# Patient Record
Sex: Female | Born: 1975 | Race: White | Hispanic: Yes | Smoking: Never smoker
Health system: Southern US, Community
[De-identification: ages and names within clinical notes are randomized; demographics above are authoritative.]

## PROBLEM LIST (undated history)

## (undated) DIAGNOSIS — E7800 Pure hypercholesterolemia, unspecified: Secondary | ICD-10-CM

## (undated) DIAGNOSIS — E559 Vitamin D deficiency, unspecified: Secondary | ICD-10-CM

## (undated) DIAGNOSIS — M25473 Effusion, unspecified ankle: Secondary | ICD-10-CM

## (undated) DIAGNOSIS — R7303 Prediabetes: Secondary | ICD-10-CM

## (undated) DIAGNOSIS — M25579 Pain in unspecified ankle and joints of unspecified foot: Secondary | ICD-10-CM

## (undated) DIAGNOSIS — D649 Anemia, unspecified: Secondary | ICD-10-CM

## (undated) DIAGNOSIS — E538 Deficiency of other specified B group vitamins: Secondary | ICD-10-CM

## (undated) DIAGNOSIS — K5900 Constipation, unspecified: Secondary | ICD-10-CM

## (undated) DIAGNOSIS — M25569 Pain in unspecified knee: Secondary | ICD-10-CM

## (undated) DIAGNOSIS — M25559 Pain in unspecified hip: Secondary | ICD-10-CM

## (undated) DIAGNOSIS — R5383 Other fatigue: Secondary | ICD-10-CM

## (undated) DIAGNOSIS — E039 Hypothyroidism, unspecified: Secondary | ICD-10-CM

## (undated) DIAGNOSIS — F419 Anxiety disorder, unspecified: Secondary | ICD-10-CM

## (undated) HISTORY — DX: Prediabetes: R73.03

## (undated) HISTORY — DX: Hypothyroidism, unspecified: E03.9

## (undated) HISTORY — DX: Pain in unspecified knee: M25.569

## (undated) HISTORY — DX: Anxiety disorder, unspecified: F41.9

## (undated) HISTORY — DX: Pain in unspecified hip: M25.559

## (undated) HISTORY — DX: Vitamin D deficiency, unspecified: E55.9

## (undated) HISTORY — DX: Anemia, unspecified: D64.9

## (undated) HISTORY — DX: Pure hypercholesterolemia, unspecified: E78.00

## (undated) HISTORY — DX: Effusion, unspecified ankle: M25.473

## (undated) HISTORY — DX: Deficiency of other specified B group vitamins: E53.8

## (undated) HISTORY — DX: Other fatigue: R53.83

## (undated) HISTORY — DX: Constipation, unspecified: K59.00

## (undated) HISTORY — DX: Pain in unspecified ankle and joints of unspecified foot: M25.579

---

## 2007-11-12 ENCOUNTER — Emergency Department (HOSPITAL_COMMUNITY): Admission: EM | Admit: 2007-11-12 | Discharge: 2007-11-12 | Payer: Self-pay | Admitting: Emergency Medicine

## 2008-05-16 ENCOUNTER — Emergency Department (HOSPITAL_COMMUNITY): Admission: EM | Admit: 2008-05-16 | Discharge: 2008-05-16 | Payer: Self-pay | Admitting: Emergency Medicine

## 2010-07-07 LAB — DIFFERENTIAL
Basophils Absolute: 0 10*3/uL (ref 0.0–0.1)
Eosinophils Relative: 0 % (ref 0–5)
Lymphocytes Relative: 10 % — ABNORMAL LOW (ref 12–46)
Lymphs Abs: 0.8 10*3/uL (ref 0.7–4.0)
Monocytes Absolute: 0.2 10*3/uL (ref 0.1–1.0)
Neutro Abs: 7.5 10*3/uL (ref 1.7–7.7)

## 2010-07-07 LAB — URINALYSIS, ROUTINE W REFLEX MICROSCOPIC
Bilirubin Urine: NEGATIVE
Glucose, UA: NEGATIVE mg/dL
Ketones, ur: NEGATIVE mg/dL
pH: 6 (ref 5.0–8.0)

## 2010-07-07 LAB — URINE MICROSCOPIC-ADD ON

## 2010-07-07 LAB — BASIC METABOLIC PANEL
Calcium: 9.1 mg/dL (ref 8.4–10.5)
GFR calc Af Amer: >60 mL/min (ref >60)
GFR calc non Af Amer: >60 mL/min (ref >60)
Potassium: 3.7 mEq/L (ref 3.5–5.1)
Sodium: 133 mEq/L — ABNORMAL LOW (ref 135–145)

## 2010-07-07 LAB — CBC
HCT: 40 % (ref 36.0–46.0)
Hemoglobin: 13.6 g/dL (ref 12.0–15.0)
WBC: 8.5 10*3/uL (ref 4.0–10.5)

## 2013-03-19 ENCOUNTER — Ambulatory Visit (INDEPENDENT_AMBULATORY_CARE_PROVIDER_SITE_OTHER): Payer: BC Managed Care – PPO | Admitting: Physician Assistant

## 2013-03-19 VITALS — BP 110/70 | HR 64 | Temp 98.0°F | Resp 16 | Ht 63.0 in | Wt 203.0 lb

## 2013-03-19 DIAGNOSIS — J029 Acute pharyngitis, unspecified: Secondary | ICD-10-CM

## 2013-03-19 DIAGNOSIS — J069 Acute upper respiratory infection, unspecified: Secondary | ICD-10-CM

## 2013-03-19 LAB — POCT RAPID STREP A (OFFICE): Rapid Strep A Screen: NEGATIVE

## 2013-03-19 MED ORDER — GUAIFENESIN ER 1200 MG PO TB12: 1.0000 | ORAL_TABLET | Freq: Two times a day (BID) | ORAL | Status: AC

## 2013-03-19 MED ORDER — IPRATROPIUM BROMIDE 0.06 % NA SOLN: 2.0000 | Freq: Three times a day (TID) | NASAL | Status: DC

## 2013-03-19 MED ORDER — HYDROCOD POLST-CHLORPHEN POLST 10-8 MG/5ML PO LQCR: 5.0000 mL | Freq: Two times a day (BID) | ORAL | Status: AC

## 2013-03-19 NOTE — Progress Notes (Signed)
   Subjective:    Patient ID: Bethany Craig, female    DOB: 1975-08-24, 37 y.o.   MRN: [id number]  HPI Pt presents to clinic with cold symptoms for about 1 week.  She started with nasal congestion with rhinorrhea and sore throat with PND and dry cough.  Now she has started to develop right ear pain without change in hearing.  She has been using OTC cold meds but they do not seem to be working all that well.  She has a sore throat that is definitely worse in the am but it does not get much better as the day goes on.    OTC meds - cold preps - no help Nephew sick with similar symptoms No flu vaccine  Review of Systems  Constitutional: Negative for fever and chills.  HENT: Positive for congestion, ear pain (right ), rhinorrhea (yellow) and sore throat. Negative for postnasal drip (resolved).   Respiratory: Positive for cough (dry).   Psychiatric/Behavioral: Positive for sleep disturbance (2nd to cough).       Objective:   Physical Exam  Vitals reviewed. Constitutional: She is oriented to person, place, and time. She appears well-developed and well-nourished.  HENT:  Head: Normocephalic and atraumatic.  Right Ear: Hearing, tympanic membrane, external ear and ear canal normal.  Left Ear: Hearing, tympanic membrane, external ear and ear canal normal.  Nose: Mucosal edema: red.  Mouth/Throat: Uvula is midline, oropharynx is clear and moist and mucous membranes are normal.  Eyes: Conjunctivae are normal.  Neck: Normal range of motion.  Cardiovascular: Normal rate, regular rhythm and normal heart sounds.   No murmur heard. Pulmonary/Chest: Effort normal and breath sounds normal. She has no wheezes.  Lymphadenopathy:    She has cervical adenopathy (AC enlarged and TTP).  Neurological: She is alert and oriented to person, place, and time.  Skin: Skin is warm and dry.  Psychiatric: She has a normal mood and affect. Her behavior is normal. Judgment and thought content normal.         Assessment & Plan:  Viral URI with cough - Plan: Guaifenesin (MUCINEX MAXIMUM STRENGTH) 1200 MG TB12, ipratropium (ATROVENT) 0.06 % nasal spray, chlorpheniramine-HYDROcodone (TUSSIONEX PENNKINETIC ER) 10-8 MG/5ML LQCR  Sore throat - Plan: POCT rapid strep A  Benny Lennert PA-C 03/19/2013 1:15 PM

## 2013-03-19 NOTE — Patient Instructions (Addendum)
Please push fluids.  Tylenol and Motrin for fever and body aches.   No more over the counter medications.

## 2013-04-01 ENCOUNTER — Emergency Department (HOSPITAL_COMMUNITY)
Admission: EM | Admit: 2013-04-01 | Discharge: 2013-04-01 | Disposition: A | Discharge status: Home / Self Care | Payer: BC Managed Care – PPO | Attending: Emergency Medicine | Admitting: Emergency Medicine

## 2013-04-01 ENCOUNTER — Encounter (HOSPITAL_COMMUNITY): Payer: Self-pay | Admitting: Emergency Medicine

## 2013-04-01 DIAGNOSIS — R52 Pain, unspecified: Secondary | ICD-10-CM | POA: Insufficient documentation

## 2013-04-01 DIAGNOSIS — R197 Diarrhea, unspecified: Secondary | ICD-10-CM | POA: Insufficient documentation

## 2013-04-01 DIAGNOSIS — Z79899 Other long term (current) drug therapy: Secondary | ICD-10-CM | POA: Insufficient documentation

## 2013-04-01 DIAGNOSIS — N898 Other specified noninflammatory disorders of vagina: Secondary | ICD-10-CM | POA: Insufficient documentation

## 2013-04-01 DIAGNOSIS — Z3202 Encounter for pregnancy test, result negative: Secondary | ICD-10-CM | POA: Insufficient documentation

## 2013-04-01 DIAGNOSIS — Z791 Long term (current) use of non-steroidal anti-inflammatories (NSAID): Secondary | ICD-10-CM | POA: Insufficient documentation

## 2013-04-01 DIAGNOSIS — R109 Unspecified abdominal pain: Secondary | ICD-10-CM | POA: Insufficient documentation

## 2013-04-01 DIAGNOSIS — J209 Acute bronchitis, unspecified: Secondary | ICD-10-CM

## 2013-04-01 LAB — URINALYSIS, ROUTINE W REFLEX MICROSCOPIC
Bilirubin Urine: NEGATIVE
GLUCOSE, UA: NEGATIVE mg/dL
KETONES UR: NEGATIVE mg/dL
LEUKOCYTES UA: NEGATIVE
Nitrite: NEGATIVE
PROTEIN: NEGATIVE mg/dL
Specific Gravity, Urine: 1.013 (ref 1.005–1.030)
Urobilinogen, UA: 0.2 mg/dL (ref 0.0–1.0)
pH: 6.5 (ref 5.0–8.0)

## 2013-04-01 LAB — WET PREP, GENITAL
CLUE CELLS WET PREP: NONE SEEN
Trich, Wet Prep: NONE SEEN
Yeast Wet Prep HPF POC: NONE SEEN

## 2013-04-01 LAB — PREGNANCY, URINE: Preg Test, Ur: NEGATIVE

## 2013-04-01 LAB — URINE MICROSCOPIC-ADD ON

## 2013-04-01 MED ORDER — AZITHROMYCIN 250 MG PO TABS: ORAL_TABLET | ORAL | Status: DC

## 2013-04-01 NOTE — ED Provider Notes (Signed)
CSN: [id number]     Arrival date & time 1/[id number]/15  [id number] History   First MD Initiated Contact with Patient 04/01/13 1026     Chief Complaint  Patient presents with  . Generalized Body Aches  . Cough  . Abdominal Pain  . Vaginal Discharge  . Diarrhea   (Consider location/radiation/quality/duration/timing/severity/associated sxs/prior Treatment) HPI Comments: Patient is a 38 year old female presents emergency department with complaints of persistent cough for 2 weeks, and congestion, and generalized malaise. She has tried over-the-counter medications, rest, and time however is not improving.  She also reports a vaginal discharge for the past several days. She denies any pelvic pain. Her last period was last month and was normal. She states she has not been sexually active and months and denies the possibility of pregnancy.  Patient is a 38 y.o. female presenting with cough. The history is provided by the patient.  Cough Cough characteristics:  Non-productive Severity:  Moderate Onset quality:  Gradual Duration:  2 weeks Timing:  Constant Progression:  Worsening Chronicity:  New Smoker: no   Context: sick contacts   Relieved by:  Nothing Worsened by:  Nothing tried Ineffective treatments:  Cough suppressants, decongestant and rest   History reviewed. No pertinent past medical history. Past Surgical History  Procedure Laterality Date  . Cesarean section     Family History  Problem Relation Age of Onset  . Diabetes Mother   . Hyperlipidemia Mother   . Stroke Mother    History  Substance Use Topics  . Smoking status: Never Smoker   . Smokeless tobacco: Not on file  . Alcohol Use: No   OB History   Grav Para Term Preterm Abortions TAB SAB Ect Mult Living                 Review of Systems  Respiratory: Positive for cough.   All other systems reviewed and are negative.    Allergies  Review of patient's allergies indicates no known allergies.  Home Medications    Current Outpatient Rx  Name  Route  Sig  Dispense  Refill  . dextromethorphan (DELSYM) 30 MG/5ML liquid   Oral   Take 30 mg by mouth as needed for cough.         Marland Kitchen. guaiFENesin (MUCINEX) 600 MG 12 hr tablet   Oral   Take 1,200 mg by mouth 2 (two) times daily.         Marland Kitchen. ipratropium (ATROVENT) 0.06 % nasal spray   Nasal   Place 2 sprays into the nose 3 (three) times daily.   15 mL   0   . naproxen sodium (ANAPROX) 220 MG tablet   Oral   Take 440 mg by mouth 2 (two) times daily with a meal.         . Pseudoeph-Doxylamine-DM-APAP (NYQUIL PO)   Oral   Take 30 mLs by mouth at bedtime.          BP 118/71  Pulse 69  Temp(Src) 98.3 F (36.8 C) (Oral)  Resp 14  SpO2 98%  LMP 03/15/2013 Physical Exam  Nursing note and vitals reviewed. Constitutional: She is oriented to person, place, and time. She appears well-developed and well-nourished. No distress.  HENT:  Head: Normocephalic and atraumatic.  Mouth/Throat: Oropharynx is clear and moist.  Bilateral TMs are clear.  Neck: Normal range of motion. Neck supple.  Cardiovascular: Normal rate and regular rhythm.  Exam reveals no gallop and no friction rub.   No murmur heard. Pulmonary/Chest:  Effort normal and breath sounds normal. No respiratory distress. She has no wheezes.  Abdominal: Soft. Bowel sounds are normal. She exhibits no distension. There is no tenderness.  Genitourinary: Uterus normal. Vaginal discharge found.  There is a slight mucousy, yellowish discharge present. There is no adnexal masses and there is no cervical motion tenderness.  Musculoskeletal: Normal range of motion.  Neurological: She is alert and oriented to person, place, and time.  Skin: Skin is warm and dry. She is not diaphoretic.    ED Course  Procedures (including critical care time) Labs Review Labs Reviewed  WET PREP, GENITAL  GC/CHLAMYDIA PROBE AMP  URINALYSIS, ROUTINE W REFLEX MICROSCOPIC  PREGNANCY, URINE   Imaging Review No  results found.    MDM  No diagnosis found. Patient presents here with persistent URI symptoms for 2 weeks. She's had no relief with over-the-counter medications. This appears to be an acute bronchitis which will be treated with Zithromax. She is also complaining of vaginal discharge. Pelvic examination is essentially unremarkable and wet prep revealed only a few white cells. She denies new sexual partners states she has not been sexually active for several months. I will wait for cultures of GC and chlamydia prior to initiating any treatment. She will be discharged with when necessary followup.    Geoffery Lyons, MD 04/01/13 1249

## 2013-04-01 NOTE — ED Notes (Signed)
Urine to lab

## 2013-04-01 NOTE — ED Notes (Addendum)
Pt c/o cough and body aches x 2 wks.  Also c/o diarrhea, "ovary pain" and vaginal discharge.

## 2013-04-01 NOTE — ED Notes (Signed)
Patient in no acute distress upon discharge.

## 2013-04-01 NOTE — ED Notes (Signed)
Pt. Unable to urinate at this time.  Pt. Aware of giving a urine specimen. Pt. Was given a cup of water per Dr. Judd Lienelo. Nurse was notified.

## 2013-04-01 NOTE — Discharge Instructions (Signed)
Zithromax as prescribed.  Return to the emergency department for difficulty breathing, chest pain, severe abdominal pain, or other new or concerning symptoms.   Acute Bronchitis Bronchitis is inflammation of the airways that extend from the windpipe into the lungs (bronchi). The inflammation often causes mucus to develop. This leads to a cough, which is the most common symptom of bronchitis.  In acute bronchitis, the condition usually develops suddenly and goes away over time, usually in a couple weeks. Smoking, allergies, and asthma can make bronchitis worse. Repeated episodes of bronchitis may cause further lung problems.  CAUSES Acute bronchitis is most often caused by the same virus that causes a cold. The virus can spread from person to person (contagious).  SIGNS AND SYMPTOMS   Cough.   Fever.   Coughing up mucus.   Body aches.   Chest congestion.   Chills.   Shortness of breath.   Sore throat.  DIAGNOSIS  Acute bronchitis is usually diagnosed through a physical exam. Tests, such as chest X-rays, are sometimes done to rule out other conditions.  TREATMENT  Acute bronchitis usually goes away in a couple weeks. Often times, no medical treatment is necessary. Medicines are sometimes given for relief of fever or cough. Antibiotics are usually not needed but may be prescribed in certain situations. In some cases, an inhaler may be recommended to help reduce shortness of breath and control the cough. A cool mist vaporizer may also be used to help thin bronchial secretions and make it easier to clear the chest.  HOME CARE INSTRUCTIONS  Get plenty of rest.   Drink enough fluids to keep your urine clear or pale yellow (unless you have a medical condition that requires fluid restriction). Increasing fluids may help thin your secretions and will prevent dehydration.   Only take over-the-counter or prescription medicines as directed by your health care provider.   Avoid  smoking and secondhand smoke. Exposure to cigarette smoke or irritating chemicals will make bronchitis worse. If you are a smoker, consider using nicotine gum or skin patches to help control withdrawal symptoms. Quitting smoking will help your lungs heal faster.   Reduce the chances of another bout of acute bronchitis by washing your hands frequently, avoiding people with cold symptoms, and trying not to touch your hands to your mouth, nose, or eyes.   Follow up with your health care provider as directed.  SEEK MEDICAL CARE IF: Your symptoms do not improve after 1 week of treatment.  SEEK IMMEDIATE MEDICAL CARE IF:  You develop an increased fever or chills.   You have chest pain.   You have severe shortness of breath.  You have bloody sputum.   You develop dehydration.  You develop fainting.  You develop repeated vomiting.  You develop a severe headache. MAKE SURE YOU:   Understand these instructions.  Will watch your condition.  Will get help right away if you are not doing well or get worse. Document Released: 04/15/2004 Document Revised: 11/08/2012 Document Reviewed: 08/29/2012 [facility] Patient Information 2014 [place] NewsExitCare, MarylandLLC.

## 2013-04-02 LAB — GC/CHLAMYDIA PROBE AMP
CT Probe RNA: NEGATIVE
GC PROBE AMP APTIMA: NEGATIVE

## 2013-07-26 ENCOUNTER — Ambulatory Visit: Payer: BC Managed Care – PPO | Admitting: Obstetrics & Gynecology

## 2013-10-21 ENCOUNTER — Encounter (HOSPITAL_COMMUNITY): Payer: Self-pay | Admitting: Emergency Medicine

## 2013-10-21 ENCOUNTER — Emergency Department (HOSPITAL_COMMUNITY)
Admission: EM | Admit: 2013-10-21 | Discharge: 2013-10-21 | Disposition: A | Discharge status: Home / Self Care | Payer: BC Managed Care – PPO | Attending: Emergency Medicine | Admitting: Emergency Medicine

## 2013-10-21 ENCOUNTER — Emergency Department (HOSPITAL_COMMUNITY): Payer: BC Managed Care – PPO

## 2013-10-21 DIAGNOSIS — M25562 Pain in left knee: Secondary | ICD-10-CM

## 2013-10-21 DIAGNOSIS — Z79899 Other long term (current) drug therapy: Secondary | ICD-10-CM | POA: Insufficient documentation

## 2013-10-21 DIAGNOSIS — M25569 Pain in unspecified knee: Secondary | ICD-10-CM | POA: Insufficient documentation

## 2013-10-21 MED ORDER — IBUPROFEN 800 MG PO TABS: 800.0000 mg | ORAL_TABLET | Freq: Three times a day (TID) | ORAL | Status: DC

## 2013-10-21 MED ORDER — IBUPROFEN 800 MG PO TABS
800.0000 mg | ORAL_TABLET | Freq: Once | ORAL | Status: AC
  Administered 2013-10-21: 800 mg via ORAL
  Filled 2013-10-21: qty 1

## 2013-10-21 NOTE — ED Notes (Signed)
She states she experienced left knee pain ~ 2 months ago as she was performing "squats" during her excersize routine.  Seh c/o persistent and increasing pain and swelling of left knee.  She is in no distress.

## 2013-10-21 NOTE — ED Provider Notes (Signed)
Medical screening examination/treatment/procedure(s) were performed by non-physician practitioner and as supervising physician I was immediately available for consultation/collaboration.   EKG Interpretation None        Candyce ChurnJohn David Elihu Milstein III, MD 10/21/13 2233

## 2013-10-21 NOTE — Discharge Instructions (Signed)
Please follow up with your primary care physician in 1-2 days. If you do not have one please call the [facility] number listed above. Please follow up with Dr. Ophelia Charter to schedule a follow up appointment.  Please follow RICE method below. Please read all discharge instructions and return precautions.   Knee Pain The knee is the complex joint between your thigh and your lower leg. It is made up of bones, tendons, ligaments, and cartilage. The bones that make up the knee are:  The femur in the thigh.  The tibia and fibula in the lower leg.  The patella or kneecap riding in the groove on the lower femur. CAUSES  Knee pain is a common complaint with many causes. A few of these causes are:  Injury, such as:  A ruptured ligament or tendon injury.  Torn cartilage.  Medical conditions, such as:  Gout  Arthritis  Infections  Overuse, over training, or overdoing a physical activity. Knee pain can be minor or severe. Knee pain can accompany debilitating injury. Minor knee problems often respond well to self-care measures or get well on their own. More serious injuries may need medical intervention or even surgery. SYMPTOMS The knee is complex. Symptoms of knee problems can vary widely. Some of the problems are:  Pain with movement and weight bearing.  Swelling and tenderness.  Buckling of the knee.  Inability to straighten or extend your knee.  Your knee locks and you cannot straighten it.  Warmth and redness with pain and fever.  Deformity or dislocation of the kneecap. DIAGNOSIS  Determining what is wrong may be very straight forward such as when there is an injury. It can also be challenging because of the complexity of the knee. Tests to make a diagnosis may include:  Your caregiver taking a history and doing a physical exam.  Routine X-rays can be used to rule out other problems. X-rays will not reveal a cartilage tear. Some injuries of the knee can be  diagnosed by:  Arthroscopy a surgical technique by which a small video camera is inserted through tiny incisions on the sides of the knee. This procedure is used to examine and repair internal knee joint problems. Tiny instruments can be used during arthroscopy to repair the torn knee cartilage (meniscus).  Arthrography is a radiology technique. A contrast liquid is directly injected into the knee joint. Internal structures of the knee joint then become visible on X-ray film.  An MRI scan is a non X-ray radiology procedure in which magnetic fields and a computer produce two- or three-dimensional images of the inside of the knee. Cartilage tears are often visible using an MRI scanner. MRI scans have largely replaced arthrography in diagnosing cartilage tears of the knee.  Blood work.  Examination of the fluid that helps to lubricate the knee joint (synovial fluid). This is done by taking a sample out using a needle and a syringe. TREATMENT The treatment of knee problems depends on the cause. Some of these treatments are:  Depending on the injury, proper casting, splinting, surgery, or physical therapy care will be needed.  Give yourself adequate recovery time. Do not overuse your joints. If you begin to get sore during workout routines, back off. Slow down or do fewer repetitions.  For repetitive activities such as cycling or running, maintain your strength and nutrition.  Alternate muscle groups. For example, if you are a weight lifter, work the upper body on one day and the lower body  the next.  Either tight or weak muscles do not give the proper support for your knee. Tight or weak muscles do not absorb the stress placed on the knee joint. Keep the muscles surrounding the knee strong.  Take care of mechanical problems.  If you have flat feet, orthotics or special shoes may help. See your caregiver if you need help.  Arch supports, sometimes with wedges on the inner or outer aspect of  the heel, can help. These can shift pressure away from the side of the knee most bothered by osteoarthritis.  A brace called an "unloader" brace also may be used to help ease the pressure on the most arthritic side of the knee.  If your caregiver has prescribed crutches, braces, wraps or ice, use as directed. The acronym for this is PRICE. This means protection, rest, ice, compression, and elevation.  Nonsteroidal anti-inflammatory drugs (NSAIDs), can help relieve pain. But if taken immediately after an injury, they may actually increase swelling. Take NSAIDs with food in your stomach. Stop them if you develop stomach problems. Do not take these if you have a history of ulcers, stomach pain, or bleeding from the bowel. Do not take without your caregiver's approval if you have problems with fluid retention, heart failure, or kidney problems.  For ongoing knee problems, physical therapy may be helpful.  Glucosamine and chondroitin are over-the-counter dietary supplements. Both may help relieve the pain of osteoarthritis in the knee. These medicines are different from the usual anti-inflammatory drugs. Glucosamine may decrease the rate of cartilage destruction.  Injections of a corticosteroid drug into your knee joint may help reduce the symptoms of an arthritis flare-up. They may provide pain relief that lasts a few months. You may have to wait a few months between injections. The injections do have a small increased risk of infection, water retention, and elevated blood sugar levels.  Hyaluronic acid injected into damaged joints may ease pain and provide lubrication. These injections may work by reducing inflammation. A series of shots may give relief for as long as 6 months.  Topical painkillers. Applying certain ointments to your skin may help relieve the pain and stiffness of osteoarthritis. Ask your pharmacist for suggestions. Many over the-counter products are approved for temporary relief of  arthritis pain.  In some countries, doctors often prescribe topical NSAIDs for relief of chronic conditions such as arthritis and tendinitis. A review of treatment with NSAID creams found that they worked as well as oral medications but without the serious side effects. PREVENTION  Maintain a healthy weight. Extra pounds put more strain on your joints.  Get strong, stay limber. Weak muscles are a common cause of knee injuries. Stretching is important. Include flexibility exercises in your workouts.  Be smart about exercise. If you have osteoarthritis, chronic knee pain or recurring injuries, you may need to change the way you exercise. This does not mean you have to stop being active. If your knees ache after jogging or playing basketball, consider switching to swimming, water aerobics, or other low-impact activities, at least for a few days a week. Sometimes limiting high-impact activities will provide relief.  Make sure your shoes fit well. Choose footwear that is right for your sport.  Protect your knees. Use the proper gear for knee-sensitive activities. Use kneepads when playing volleyball or laying carpet. Buckle your seat belt every time you drive. Most shattered kneecaps occur in car accidents.  Rest when you are tired. SEEK MEDICAL CARE IF:  You have knee  pain that is continual and does not seem to be getting better.  SEEK IMMEDIATE MEDICAL CARE IF:  Your knee joint feels hot to the touch and you have a high fever. MAKE SURE YOU:   Understand these instructions.  Will watch your condition.  Will get help right away if you are not doing well or get worse. Document Released: 01/03/2007 Document Revised: 05/31/2011 Document Reviewed: 01/03/2007 [facility] SystemExit[facility] Patient Information 2015 [place], [place]LL[place]. This information is not intended to replace advice given to you by your health care provider. Make sure you discuss any questions you have with your health care provider.  RICE: Routine  Care for Injuries The routine care of many injuries includes Rest, Ice, Compression, and Elevation (RICE). HOME CARE INSTRUCTIONS  Rest is needed to allow your body to heal. Routine activities can usually be resumed when comfortable. Injured tendons and bones can take up to 6 weeks to heal. Tendons are the cord-like structures that attach muscle to bone.  Ice following an injury helps keep the swelling down and reduces pain.  Put ice in a plastic bag.  Place a towel between your skin and the bag.  Leave the ice on for 15-20 minutes, 3-4 times a day, or as directed by your health care provider. Do this while awake, for the first 24 to 48 hours. After that, continue as directed by your caregiver.  Compression helps keep swelling down. It also gives support and helps with discomfort. If an elastic bandage has been applied, it should be removed and reapplied every 3 to 4 hours. It should not be applied tightly, but firmly enough to keep swelling down. Watch fingers or toes for swelling, bluish discoloration, coldness, numbness, or excessive pain. If any of these problems occur, remove the bandage and reapply loosely. Contact your caregiver if these problems continue.  Elevation helps reduce swelling and decreases pain. With extremities, such as the arms, hands, legs, and feet, the injured area should be placed near or above the level of the heart, if possible. SEEK IMMEDIATE MEDICAL CARE IF:  You have persistent pain and swelling.  You develop redness, numbness, or unexpected weakness.  Your symptoms are getting worse rather than improving after several days. These symptoms may indicate that further evaluation or further X-rays are needed. Sometimes, X-rays may not show a small broken bone (fracture) until 1 week or 10 days later. Make a follow-up appointment with your caregiver. Ask when your X-ray results will be ready. Make sure you get your X-ray results. Document Released: 06/20/2000  Document Revised: 03/13/2013 Document Reviewed: 08/07/2010 [facility] CenterExitCare Patient Information 2015 [place]ExitCare, [place]LLC. This information is not intended to replace advice given to you by your health care provider. Make sure you discuss any questions you have with your health care provider.

## 2013-10-21 NOTE — ED Provider Notes (Signed)
CSN: [id number]     Arrival date & time 10/21/13  1657 History   First MD Initiated Contact with Patient 10/21/13 1751     Chief Complaint  Patient presents with  . Knee Pain     (Consider location/radiation/quality/duration/timing/severity/associated sxs/prior Treatment) HPI Comments: Patient is a 38 yo F presenting to the ED for left knee pain that has been gradually worsening over the last two months after doing squats at the gym. She has had persistent mild to moderate achy throbbing pain to the knee w/o radiation. Alleviating factors: rest. Aggravating factors: ambulation, palpation. Medications tried prior to arrival: Tylenol. No history of fall or injuries. No previous injury to the knee. Denies any numbness, fevers, chills.     Patient is a 38 y.o. female presenting with knee pain.  Knee Pain Associated symptoms: no fever     History reviewed. No pertinent past medical history. Past Surgical History  Procedure Laterality Date  . Cesarean section     Family History  Problem Relation Age of Onset  . Diabetes Mother   . Hyperlipidemia Mother   . Stroke Mother    History  Substance Use Topics  . Smoking status: Never Smoker   . Smokeless tobacco: Not on file  . Alcohol Use: No   OB History   Grav Para Term Preterm Abortions TAB SAB Ect Mult Living                 Review of Systems  Constitutional: Negative for fever and chills.  Musculoskeletal: Positive for arthralgias and myalgias.  Skin: Negative for color change.  All other systems reviewed and are negative.     Allergies  Codeine  Home Medications   Prior to Admission medications   Medication Sig Start Date End Date Taking? Authorizing Provider  acetaminophen (TYLENOL) 500 MG tablet Take 1,000 mg by mouth every 6 (six) hours as needed for mild pain.   Yes Historical Provider, MD  ibuprofen (ADVIL,MOTRIN) 800 MG tablet Take 1 tablet (800 mg total) by mouth 3 (three) times daily. 10/21/13   Salisha Bardsley L  Shila Kruczek, PA-C   BP 125/71  Pulse 80  Temp(Src) 98.2 F (36.8 C) (Oral)  Resp 14  SpO2 100%  LMP 10/10/2013 Physical Exam  Nursing note and vitals reviewed. Constitutional: She is oriented to person, place, and time. She appears well-developed and well-nourished. No distress.  HENT:  Head: Normocephalic and atraumatic.  Right Ear: External ear normal.  Left Ear: External ear normal.  Nose: Nose normal.  Mouth/Throat: Oropharynx is clear and moist.  Eyes: Conjunctivae are normal.  Neck: Normal range of motion. Neck supple.  Cardiovascular: Normal rate, regular rhythm, normal heart sounds and intact distal pulses.   Pulmonary/Chest: Effort normal and breath sounds normal.  Abdominal: Soft.  Musculoskeletal: Normal range of motion.       Right knee: Normal.       Left knee: She exhibits swelling (mild). She exhibits normal range of motion, no ecchymosis, no deformity, no laceration, no erythema, normal alignment and normal patellar mobility. Tenderness found. Medial joint line and patellar tendon tenderness noted. No lateral joint line, no MCL and no LCL tenderness noted.       Right ankle: Normal.       Left ankle: Normal.       Right lower leg: Normal.       Left lower leg: Normal.       Right foot: Normal.       Left foot: Normal.  Negative Lachman's test.  No laxity noted on valgus and varus stress  Neurological: She is alert and oriented to person, place, and time.  Skin: Skin is warm and dry. She is not diaphoretic. No erythema.  Psychiatric: She has a normal mood and affect.    ED Course  Procedures (including critical care time) Medications  ibuprofen (ADVIL,MOTRIN) tablet 800 mg (800 mg Oral Given 10/21/13 1847)    Labs Review Labs Reviewed - No data to display  Imaging Review Dg Knee Complete 4 Views Left  10/21/2013   CLINICAL DATA:  Left knee pain 2 months  EXAM: LEFT KNEE - COMPLETE 4+ VIEW  COMPARISON:  None.  FINDINGS: Small joint effusion. Negative for  fracture. Joint spaces are maintained and there is no significant degenerative change.  IMPRESSION: Small joint effusion.   Electronically Signed   By: Marlan Palau M.D.   On: 10/21/2013 17:31     EKG Interpretation None      MDM   Final diagnoses:  Left knee pain    Filed Vitals:   10/21/13 1710  BP: 125/71  Pulse: 80  Temp: 98.2 F (36.8 C)  Resp: 14   Afebrile, NAD, non-toxic appearing, AAOx4.  Neurovascularly intact. Normal sensation. Patient X-Ray negative for obvious fracture or dislocation.Small effusion noted on X-ray. Pain managed in ED. Pt advised to follow up with orthopedics if symptoms persist for possible ligament or meniscal injury. Patient given brace while in ED, conservative therapy recommended and discussed. Patient will be dc home & is agreeable with above plan. Patient is stable at time of discharge.      Jeannetta Ellis, PA-C 10/21/13 1900

## 2013-12-07 ENCOUNTER — Encounter (HOSPITAL_COMMUNITY): Payer: Self-pay | Admitting: Emergency Medicine

## 2013-12-07 ENCOUNTER — Emergency Department (HOSPITAL_COMMUNITY)
Admission: EM | Admit: 2013-12-07 | Discharge: 2013-12-07 | Disposition: A | Discharge status: Home / Self Care | Payer: BC Managed Care – PPO | Attending: Emergency Medicine | Admitting: Emergency Medicine

## 2013-12-07 ENCOUNTER — Emergency Department (HOSPITAL_COMMUNITY): Payer: BC Managed Care – PPO

## 2013-12-07 DIAGNOSIS — R079 Chest pain, unspecified: Secondary | ICD-10-CM | POA: Insufficient documentation

## 2013-12-07 DIAGNOSIS — R071 Chest pain on breathing: Secondary | ICD-10-CM | POA: Insufficient documentation

## 2013-12-07 DIAGNOSIS — R0789 Other chest pain: Secondary | ICD-10-CM

## 2013-12-07 DIAGNOSIS — Z791 Long term (current) use of non-steroidal anti-inflammatories (NSAID): Secondary | ICD-10-CM | POA: Insufficient documentation

## 2013-12-07 LAB — BASIC METABOLIC PANEL
ANION GAP: 11 (ref 5–15)
BUN: 14 mg/dL (ref 6–23)
CALCIUM: 9.4 mg/dL (ref 8.4–10.5)
CO2: 25 mEq/L (ref 19–32)
CREATININE: 0.63 mg/dL (ref 0.50–1.10)
Chloride: 103 mEq/L (ref 96–112)
Glucose, Bld: 93 mg/dL (ref 70–99)
Potassium: 3.8 mEq/L (ref 3.7–5.3)
Sodium: 139 mEq/L (ref 137–147)

## 2013-12-07 LAB — I-STAT TROPONIN, ED: Troponin i, poc: 0 ng/mL (ref 0.00–0.08)

## 2013-12-07 LAB — CBC
HCT: 36.9 % (ref 36.0–46.0)
Hemoglobin: 12.8 g/dL (ref 12.0–15.0)
MCH: 30.7 pg (ref 26.0–34.0)
MCHC: 34.7 g/dL (ref 30.0–36.0)
MCV: 88.5 fL (ref 78.0–100.0)
PLATELETS: 357 10*3/uL (ref 150–400)
RBC: 4.17 MIL/uL (ref 3.87–5.11)
RDW: 12.4 % (ref 11.5–15.5)
WBC: 6.3 10*3/uL (ref 4.0–10.5)

## 2013-12-07 MED ORDER — NAPROXEN 500 MG PO TABS: 500.0000 mg | ORAL_TABLET | Freq: Two times a day (BID) | ORAL | Status: DC

## 2013-12-07 MED ORDER — KETOROLAC TROMETHAMINE 30 MG/ML IJ SOLN
60.0000 mg | Freq: Once | INTRAMUSCULAR | Status: AC
  Administered 2013-12-07: 60 mg via INTRAMUSCULAR
  Filled 2013-12-07: qty 2

## 2013-12-07 NOTE — ED Notes (Signed)
Brought pt back to room; pt now getting undressed and into a gown at this time; Chastity, NT aware of pt

## 2013-12-07 NOTE — Discharge Instructions (Signed)

## 2013-12-07 NOTE — ED Notes (Signed)
Pt having chest pain, left arm and shoulder pain. sts some pain with breathing in and SOB. sts for 2 days.

## 2013-12-07 NOTE — ED Provider Notes (Signed)
CSN: [id number]     Arrival date & time 12/07/13  1046 History   First MD Initiated Contact with Patient 12/07/13 1536     Chief Complaint  Patient presents with  . Chest Pain     (Consider location/radiation/quality/duration/timing/severity/associated sxs/prior Treatment) Patient is a 38 y.o. female presenting with chest pain. The history is provided by the patient. No language interpreter was used.  Chest Pain Pain location:  Substernal area and L chest Pain quality: tightness   Pain radiates to:  Does not radiate Pain radiates to the back: no   Pain severity:  Moderate Onset quality:  Sudden Duration:  9 hours Timing:  Constant Progression:  Unchanged Chronicity:  New Context: at rest   Relieved by:  Nothing Worsened by:  Deep breathing (stretching of her shoulders) Associated symptoms: fatigue (3 days), shortness of breath and weakness (generalixed)   Associated symptoms: no abdominal pain, no anorexia, no anxiety, no back pain, no cough, no diaphoresis, no fever, no headache, no lower extremity edema, no nausea, no numbness, no palpitations and not vomiting   Shortness of breath:    Severity:  Mild   Duration:  9 hours   Timing:  Constant   Progression:  Unchanged Risk factors: no aortic disease, no birth control, no coronary artery disease, no diabetes mellitus, no Ehlers-Danlos syndrome, no high cholesterol, no hypertension, no immobilization, not female, no Marfan's syndrome, not obese, not pregnant, no prior DVT/PE, no smoking and no surgery     History reviewed. No pertinent past medical history. Past Surgical History  Procedure Laterality Date  . Cesarean section     Family History  Problem Relation Age of Onset  . Diabetes Mother   . Hyperlipidemia Mother   . Stroke Mother    History  Substance Use Topics  . Smoking status: Never Smoker   . Smokeless tobacco: Not on file  . Alcohol Use: No   OB History   Grav Para Term Preterm Abortions TAB SAB Ect Mult  Living                 Review of Systems  Constitutional: Positive for fatigue (3 days). Negative for fever, chills, diaphoresis, activity change and appetite change.  HENT: Negative for congestion, facial swelling, rhinorrhea and sore throat.   Eyes: Negative for photophobia and discharge.  Respiratory: Positive for shortness of breath. Negative for cough and chest tightness.   Cardiovascular: Positive for chest pain. Negative for palpitations and leg swelling.  Gastrointestinal: Negative for nausea, vomiting, abdominal pain, diarrhea and anorexia.  Endocrine: Negative for polydipsia and polyuria.  Genitourinary: Negative for dysuria, frequency, difficulty urinating and pelvic pain.  Musculoskeletal: Negative for arthralgias, back pain, neck pain and neck stiffness.  Skin: Negative for color change and wound.  Allergic/Immunologic: Negative for immunocompromised state.  Neurological: Positive for weakness (generalixed). Negative for facial asymmetry, numbness and headaches.  Hematological: Does not bruise/bleed easily.  Psychiatric/Behavioral: Negative for confusion and agitation.      Allergies  Codeine  Home Medications   Prior to Admission medications   Medication Sig Start Date End Date Taking? Authorizing Provider  ibuprofen (ADVIL,MOTRIN) 800 MG tablet Take 1 tablet (800 mg total) by mouth 3 (three) times daily. 10/21/13  Yes Jennifer L Piepenbrink, PA-C  naproxen (NAPROSYN) 500 MG tablet Take 1 tablet (500 mg total) by mouth 2 (two) times daily. 12/07/13   Toy Cookey, MD   BP 106/57  Pulse 71  Temp(Src) 98.3 F (36.8 C)  Resp 14  SpO2 100%  LMP 11/23/2013 Physical Exam  Constitutional: She is oriented to person, place, and time. She appears well-developed and well-nourished. No distress.  HENT:  Head: Normocephalic and atraumatic.  Mouth/Throat: No oropharyngeal exudate.  Eyes: Pupils are equal, round, and reactive to light.  Neck: Normal range of motion. Neck  supple.  Cardiovascular: Normal rate, regular rhythm and normal heart sounds.  Exam reveals no gallop and no friction rub.   No murmur heard. Pulmonary/Chest: Effort normal and breath sounds normal. No respiratory distress. She has no wheezes. She has no rales. She exhibits tenderness.    Abdominal: Soft. Bowel sounds are normal. She exhibits no distension and no mass. There is no tenderness. There is no rebound and no guarding.  Musculoskeletal: Normal range of motion. She exhibits no edema and no tenderness.  Neurological: She is alert and oriented to person, place, and time.  Skin: Skin is warm and dry.  Psychiatric: She has a normal mood and affect.    ED Course  Procedures (including critical care time) Labs Review Labs Reviewed  CBC  BASIC METABOLIC PANEL  Rosezena Sensor, ED    Imaging Review Dg Chest 2 View  12/07/2013   CLINICAL DATA:  Chest pain and difficulty breathing  EXAM: CHEST  2 VIEW  COMPARISON:  May 16, 2008  FINDINGS: Lungs are clear. Heart size and pulmonary vascularity are normal. No adenopathy. No pneumothorax. No bone lesions. Next no abnormality noted.  IMPRESSION: No active cardiopulmonary disease.   Electronically Signed   By: Bretta Bang M.D.   On: 12/07/2013 11:57     EKG Interpretation   Date/Time:  Friday December 07 2013 10:57:45 EDT Ventricular Rate:  77 PR Interval:  154 QRS Duration: 86 QT Interval:  380 QTC Calculation: 430 R Axis:   87 Text Interpretation:  Normal sinus rhythm Low voltage QRS Borderline ECG  No prior for comparison Confirmed by Paz Winsett  MD, Eliu Batch ([id number]) on  12/07/2013 3:37:38 PM      MDM   Final diagnoses:  Chest wall pain    Pt is a 38 y.o. female with Pmhx as above who presents with a central and left-sided chest tightness worse with inspiration and worse with stretching of her shoulders for 9 hours. She has mild shortness of breath but denies associated nausea, vomiting, diaphoresis, cough, lower  [place] pain or swelling. She is a nonsmoker. No estrogen use. She reports a brother with history of MI in his 46s, but states that he did not have stents placed, open-heart surgery, and does not see a cardiologist. On physical exam vital signs are stable and she is in no acute distress. EKG has no acute ischemic changes. Chest x-ray is normal. I-STAT troponin is negative which is reassuring given her constant nature for pain for 9 hours. She is reproducible chest wall pain on physical exam. Physical exam is otherwise unremarkable. She is PERC negative.  I doubt ACS and also doubt PE, pneumonia, pneumothorax. At this pain is much likely to be musculoskeletal in nature. 60 mg IM Toradol given with some improvement. Will d/c with scheduled naproxen. Return precautions given for new or worsening symptoms including worsening pain, assoc symptoms such as worsening SOB, n/v, diaphoresis, leg pain/swelling.          Toy Cookey, MD 12/07/13 [phone]

## 2014-03-18 ENCOUNTER — Encounter: Payer: Self-pay | Admitting: *Deleted

## 2014-03-19 ENCOUNTER — Encounter: Payer: Self-pay | Admitting: Obstetrics & Gynecology

## 2016-01-14 IMAGING — CR DG CHEST 2V
2 series · 2 of 2 positions shown · non-contrast
Comparison: May 16, 2008

CLINICAL DATA: Chest pain and difficulty breathing

EXAM:
CHEST  2 VIEW

[w chest pa]
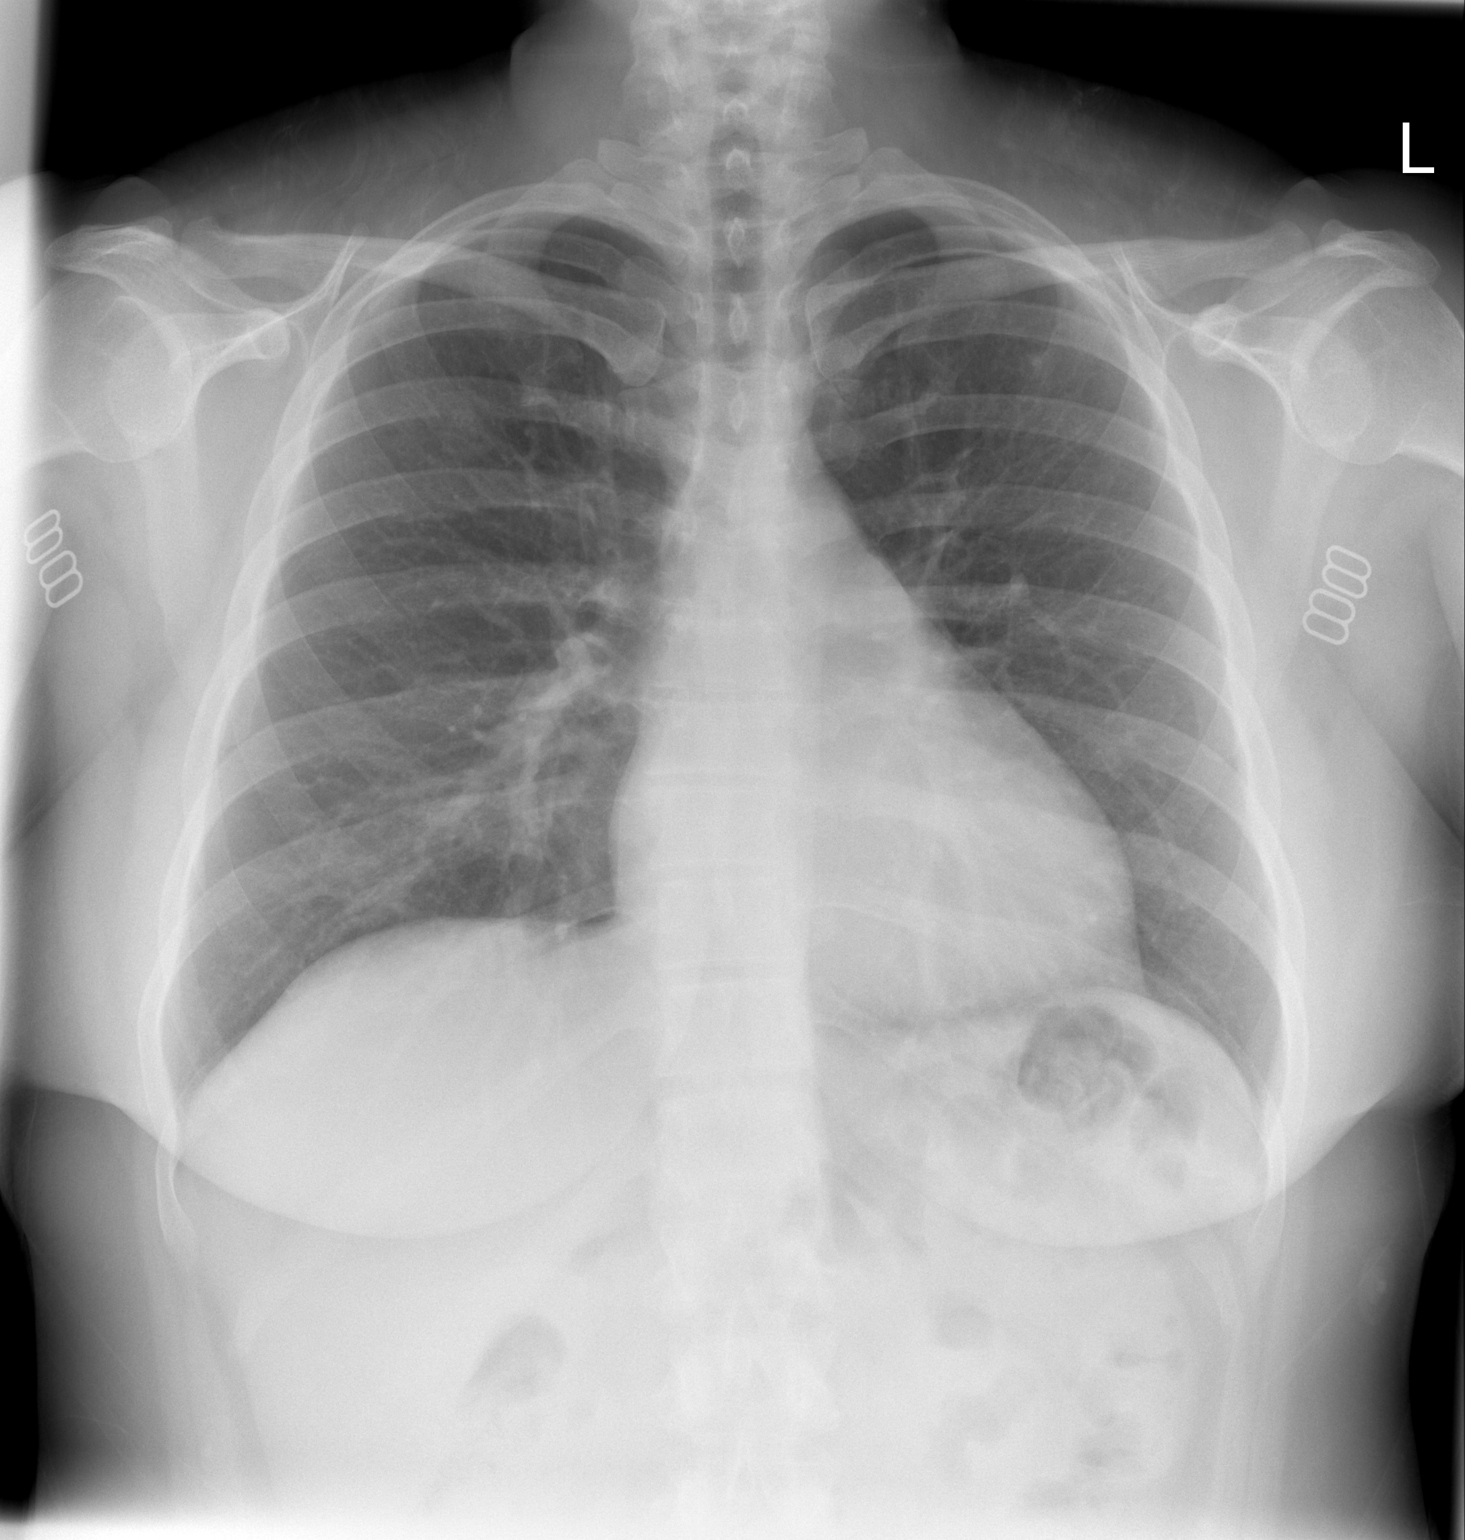

[w chest lat]
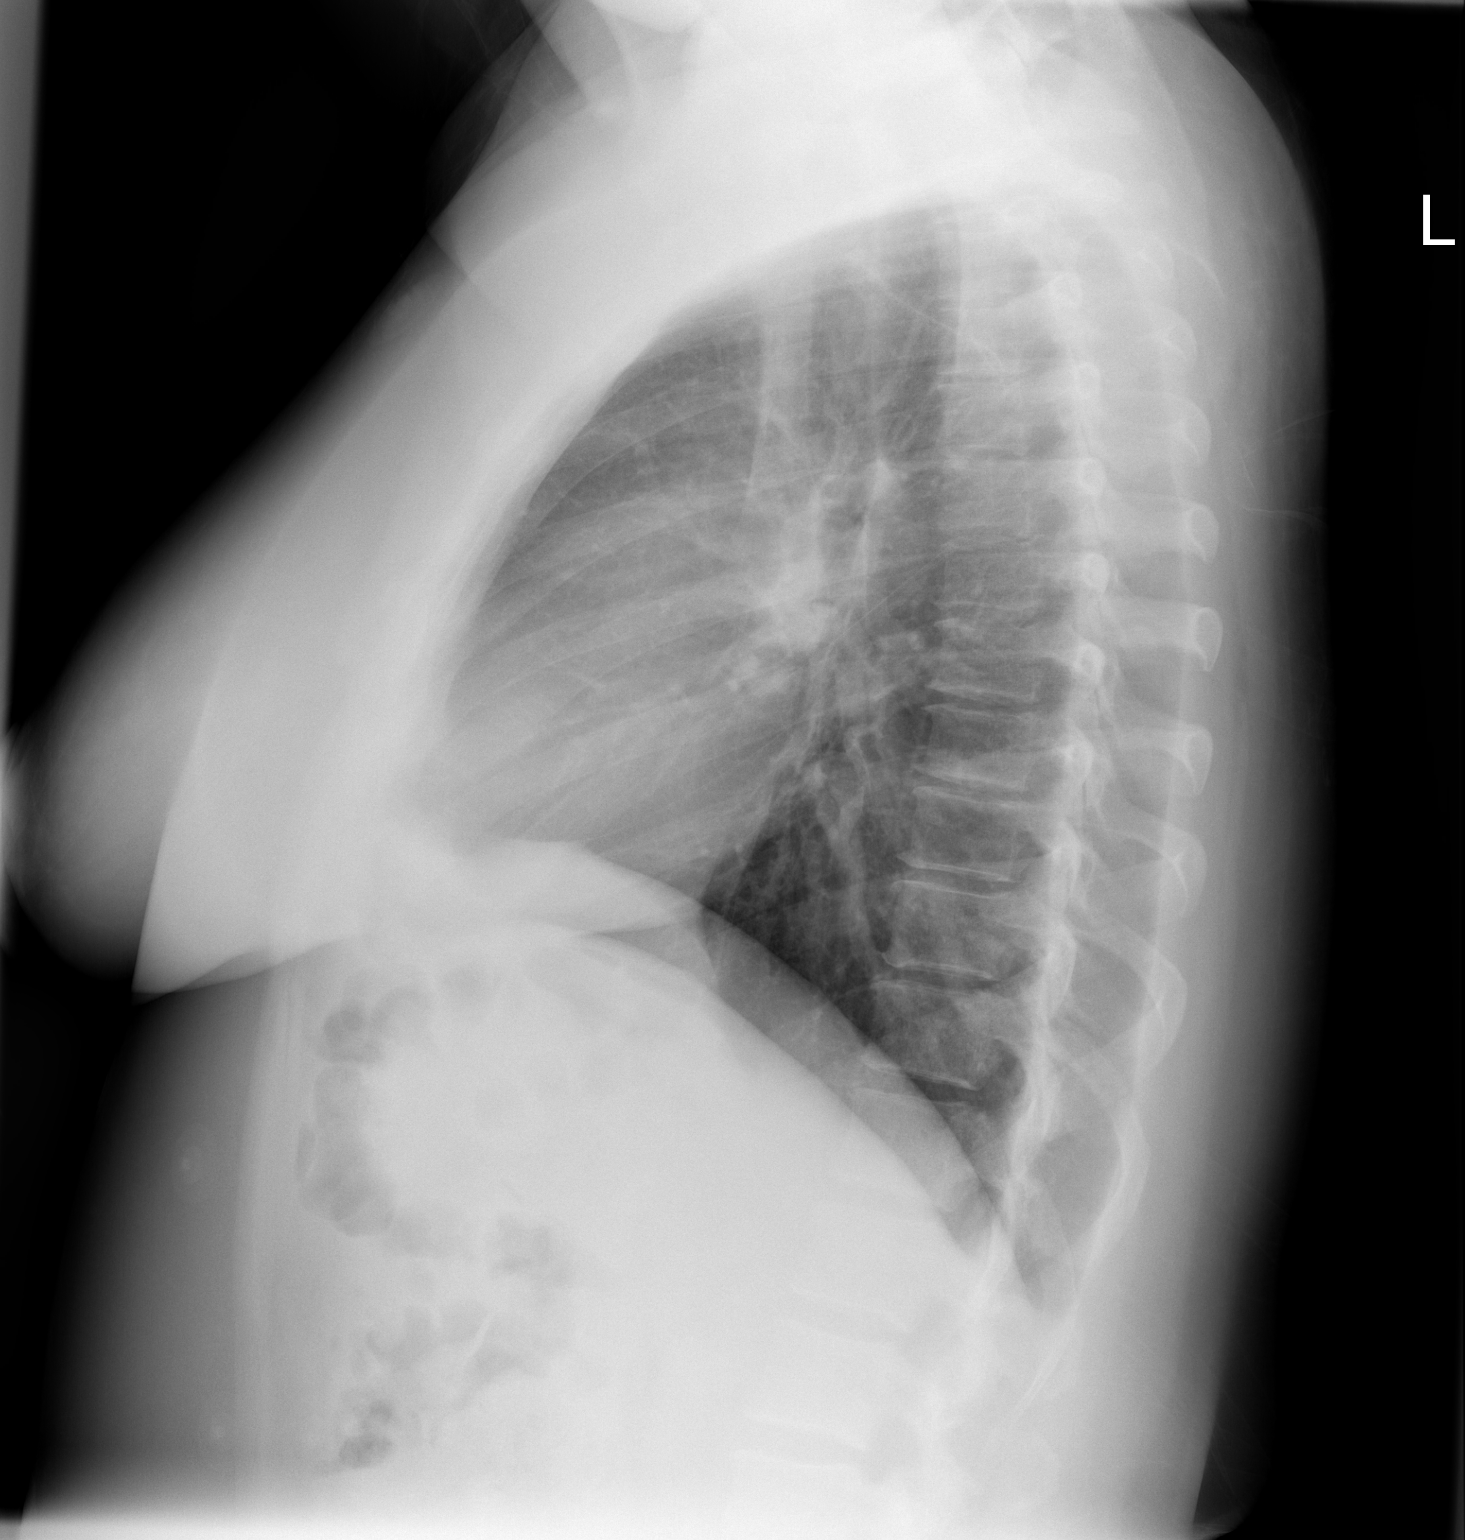

[2 of 2 positions shown; findings below may reference images not displayed]

FINDINGS: Lungs are clear. Heart size and pulmonary vascularity are normal. No
adenopathy. No pneumothorax. No bone lesions. Next no abnormality
noted.
IMPRESSION: No active cardiopulmonary disease.

## 2018-10-09 ENCOUNTER — Other Ambulatory Visit: Payer: Self-pay

## 2018-10-09 ENCOUNTER — Emergency Department (HOSPITAL_COMMUNITY)
Admission: EM | Admit: 2018-10-09 | Discharge: 2018-10-09 | Disposition: A | Discharge status: Home / Self Care | Payer: 59 | Attending: Emergency Medicine | Admitting: Emergency Medicine

## 2018-10-09 ENCOUNTER — Encounter (HOSPITAL_COMMUNITY): Payer: Self-pay

## 2018-10-09 DIAGNOSIS — Z20828 Contact with and (suspected) exposure to other viral communicable diseases: Secondary | ICD-10-CM | POA: Insufficient documentation

## 2018-10-09 DIAGNOSIS — J029 Acute pharyngitis, unspecified: Secondary | ICD-10-CM | POA: Diagnosis present

## 2018-10-09 DIAGNOSIS — B349 Viral infection, unspecified: Secondary | ICD-10-CM | POA: Diagnosis not present

## 2018-10-09 DIAGNOSIS — Z79899 Other long term (current) drug therapy: Secondary | ICD-10-CM | POA: Diagnosis not present

## 2018-10-09 LAB — GROUP A STREP BY PCR: Group A Strep by PCR: NOT DETECTED

## 2018-10-09 NOTE — ED Notes (Signed)
Patient verbalizes understanding of discharge instructions. Opportunity for questioning and answers were provided. Armband removed by staff, pt discharged from ED.  

## 2018-10-09 NOTE — ED Provider Notes (Signed)
[facility]   CSN: [id number] Arrival date & time: 10/09/18  1145     History   Chief Complaint Chief Complaint  Patient presents with  . covid symptoms    HPI Bethany Craig is a 43 y.o. female.     HPI   Patient is a 43 year old female with no significant past medical history presenting for sore throat, congestion, rhinorrhea and headache.  Patient reports that symptoms began 2 days ago with a sore throat.  Reports that the posterior throat feels "scratchy".  Denies difficulty breathing or swallowing.  She reports that she subsequently developed nasal congestion and rhinorrhea.  She has a frontal headache.  She has a history of getting headaches, but usually does not when she is sick.  Denies fevers, chills, cough, shortness of breath, nausea, vomiting, abdominal pain, dysuria, urgency, frequency or flank pain.  Denies taking remedies at home.  No known COVID-19 exposures however she has been an outdoor large gatherings for her daughter softball team.  History reviewed. No pertinent past medical history.  There are no active problems to display for this patient.   Past Surgical History:  Procedure Laterality Date  . CESAREAN SECTION       OB History   No obstetric history on file.      Home Medications    Prior to Admission medications   Medication Sig Start Date End Date Taking? Authorizing Provider  ibuprofen (ADVIL,MOTRIN) 800 MG tablet Take 1 tablet (800 mg total) by mouth 3 (three) times daily. 10/21/13   Piepenbrink, Victorino DikeJennifer, PA-C  naproxen (NAPROSYN) 500 MG tablet Take 1 tablet (500 mg total) by mouth 2 (two) times daily. 12/07/13   Toy Cookeyocherty, Megan, MD    Family History Family History  Problem Relation Age of Onset  . Diabetes Mother   . Hyperlipidemia Mother   . Stroke Mother     Social History Social History   Tobacco Use  . Smoking status: Never Smoker  . Smokeless tobacco: Never Used   Substance Use Topics  . Alcohol use: No  . Drug use: No     Allergies   Codeine   Review of Systems Review of Systems  Constitutional: Negative for chills and fever.  HENT: Positive for congestion, rhinorrhea and sore throat. Negative for ear pain.   Respiratory: Negative for cough, wheezing and stridor.   Gastrointestinal: Negative for nausea and vomiting.     Physical Exam Updated Vital Signs BP 122/79   Pulse 73   Temp 98.3 F (36.8 C) (Oral)   Resp 16   Ht 5\' 3"  (1.6 m)   Wt 104.3 kg   LMP 10/03/2018   SpO2 100%   BMI 40.74 kg/m   Physical Exam Vitals signs and nursing note reviewed.  Constitutional:      General: She is not in acute distress.    Appearance: She is well-developed.  HENT:     Head: Normocephalic and atraumatic.     Right Ear: Tympanic membrane normal.     Left Ear: Tympanic membrane normal.     Mouth/Throat:     Comments: Normal phonation. No muffled voice sounds. Patient swallows secretions without difficulty. Dentition normal. No lesions of tongue or buccal mucosa. Uvula midline. No asymmetric swelling of the posterior pharynx. Minimal erythema of posterior pharynx. No tonsillar exuduate. No lingual swelling. No induration inferior to tongue. No submandibular tenderness, swelling, or induration.  Tissues of the neck supple. No cervical lymphadenopathy. Right TM  without erythema or effusion; left TM without erythema or effusion.  Eyes:     Conjunctiva/sclera: Conjunctivae normal.     Pupils: Pupils are equal, round, and reactive to light.  Neck:     Musculoskeletal: Normal range of motion and neck supple.  Cardiovascular:     Rate and Rhythm: Normal rate and regular rhythm.     Heart sounds: S1 normal and S2 normal. No murmur.  Pulmonary:     Effort: Pulmonary effort is normal.     Breath sounds: Normal breath sounds. No wheezing or rales.  Abdominal:     General: There is no distension.     Palpations: Abdomen is soft.      Tenderness: There is no abdominal tenderness. There is no guarding.  Musculoskeletal: Normal range of motion.        General: No deformity.  Lymphadenopathy:     Cervical: No cervical adenopathy.  Skin:    General: Skin is warm and dry.     Findings: No erythema or rash.  Neurological:     Mental Status: She is alert.     Comments: Cranial nerves grossly intact. Patient moves extremities symmetrically and with good coordination.  Psychiatric:        Behavior: Behavior normal.        Thought Content: Thought content normal.        Judgment: Judgment normal.      ED Treatments / Results  Labs (all labs ordered are listed, but only abnormal results are displayed) Labs Reviewed  GROUP A STREP BY PCR  NOVEL CORONAVIRUS, NAA (HOSPITAL ORDER, SEND-OUT TO REF LAB)    EKG None  Radiology No results found.  Procedures Procedures (including critical care time)  Medications Ordered in ED Medications - No data to display   Initial Impression / Assessment and Plan / ED Course  I have reviewed the triage vital signs and the nursing notes.  Pertinent labs & imaging results that were available during my care of the patient were reviewed by me and considered in my medical decision making (see chart for details).  Clinical Course as of Oct 09 1542  Mon Oct 09, 2018  1452 Group A Strep by PCR: NOT DETECTED [AM]    Clinical Course User Index [AM] Elisha PonderMurray, Daphna Lafuente B, PA-C      This is a well-appearing 43 year old female with no significant past medical history presenting for sore throat, congestion, and headache.  She is well-appearing with normal vital signs.  Neurologically intact.  No signs of RPA or PTA or deep space infection of the head and neck.  She is tolerating secretions here in the emergency department.  Send out test for COVID-19 performed.  Rapid strep is negative.  Patient instructed to take over-the-counter analgesics and Mucinex DM for congestion.  Return precautions  given for any worsening symptoms, shortness of breath, intractable nausea or vomiting.  She was instructed on the natural course of COVID-19 and reasons to return should her test return positive.  Patient is in understanding and agrees with the plan of care.  Bethany Craig was evaluated in Emergency Department on 10/09/2018 for the symptoms described in the history of present illness. She was evaluated in the context of the global COVID-19 pandemic, which necessitated consideration that the patient might be at risk for infection with the SARS-CoV-2 virus that causes COVID-19. Institutional protocols and algorithms that pertain to the evaluation of patients at risk for COVID-19 are in a state of rapid change  based on information released by regulatory bodies including the CDC and federal and state organizations. These policies and algorithms were followed during the patient's care in the ED.  Final Clinical Impressions(s) / ED Diagnoses   Final diagnoses:  Sore throat  Viral illness    ED Discharge Orders    None       Tamala Julian 10/09/18 [place], [place], DO 10/09/18 1655

## 2018-10-09 NOTE — Discharge Instructions (Addendum)
Please see the information and instructions below regarding your visit.  Your diagnoses today include:  1. Sore throat   2. Viral illness    You have been tested for COVID-19.  This test will result in approximately 24 to 96 hours.  You will get a call for positive results.  To see a result as soon as possible you can follow it on my chart.  You are diagnosed today with a viral respiratory illness. One of the major respiratory illnesses of concern circulating in our community is COVID-19. The Novel Coronavirus 2019, was first reported on in [place] in late December 2019.  The outbreak was declared a public health emergency of international concern in January 2020 and on March 11th, 2020, the outbreak was declared a global pandemic.  The spread of this virus is now global with lots of media attention.  The virus has been named SARS-CoV-2 and the disease it causes has become known as coronavirus disease 2019 (COVID-19). Although symptoms can be variable, the predominant symptoms are fever, cough, and shortness of breath.   The incubation period (period during which the patient has the disease and can spread it to others but does not have symptoms of disease) is from 5-14 days.   [place] is considered to have "community spread" of COVID-19. This means that we are seeing cases of COVID-19 that we cannot connect to other known positive cases. Please refer to the information below on Home Care and from the [place] Department of Health and Human Services to guide you on keeping your family safe and returning to normal activity.  Tests performed today include: See side panel of your discharge paperwork for testing performed today. Vital signs are listed at the bottom of these instructions.   Medications prescribed:    Take any prescribed medications only as prescribed, and any over the counter medications only as directed on the packaging.  There is ongoing research on the best medication to take  for fever with COVID-19. Some preliminary information suggested a hypothetical adverse response to non-steroidal anti-inflammatory medications (ibuprofen/Motrin/Advil, or naproxen/Aleve). This is not proven in literature just yet. It is reasonable to initially try to control fever with Acetaminophen/Tylenol, 650 mg every 4 hours or 1000 mg every 6 hours. DO NOT EXCEED 4000 MG OF ACETAMINOPHEN/TYLENOL IN ONE DAY AS IT CAN HARM THE LIVER.  If fever and body aches are not not controlled on Acetaminophen/Tylenol alone and you can safely take non-steroidal anti-inflammatories, I recommend ibuprofen 600 mg every 6 hours as needed. This is not a long-term medication unless under the care and direction of your primary provider. Taking this medication long-term and not under the supervision of a healthcare provider could increase the risk of stomach ulcers, kidney problems, and cardiovascular problems such as high blood pressure.   Please take Mucinex DM twice a day for 5 to 7 days.  Home care instructions:  Please follow any educational materials contained in this packet.   Since we do not have the ability to widely test patients for COVID-19, the Centers for Disease Control and Prevention (CDC) recommend the following guidelines currently for self-quarantine during a respiratory illness:   -At least 3 days (72 hours) have passed since recovery defined as resolution of fever without the use of fever-reducing medications and improvement in respiratory symptoms (e.g., cough, shortness of breath); and, -At least 10 days have passed since symptoms first appeared.  Your illness is contagious and can be spread to others, especially during  the phase of illness during which you have a fever. It cannot be cured by antibiotics or other medicines. Take basic precautions such as washing your hands often, covering your mouth when you cough or sneeze, and avoiding public places where you could spread your illness to others.    Please continue drinking plenty of fluids.  Use over-the-counter medicines as needed as directed on packaging for symptom relief.  You may also use ibuprofen or tylenol as directed on packaging for pain or fever.  Do not take multiple medicines containing Tylenol or acetaminophen to avoid taking too much of this medication.  Follow-up instructions:   Return instructions:  Please return to the Emergency Department if you experience worsening symptoms.  RETURN IMMEDIATELY IF you develop shortness of breath, chest pain, confusion or altered mental status, a new rash, become dizzy, faint, or poorly responsive, or are unable to be cared for at home. Some patients develop bacterial pneumonia as a result of viral respiratory infections. Signs and symptoms include return of fever, increased production of green or yellow phlegm, chest pain, shortness of breath. You need to be reevaluated if you experience these symptoms. Please return if you have persistent vomiting and cannot keep down fluids or develop a fever that is not controlled by Acetaminophen (Tylenol) or Ibuprofen (Motrin). Please return if you have any other emergent concerns.   Additional Information:   Your vital signs today were: BP 109/69    Pulse 79    Temp 98.3 F (36.8 C) (Oral)    Resp 16    Ht 5\' 3"  (1.6 m)    Wt 104.3 kg    LMP 10/03/2018    SpO2 99%    BMI 40.74 kg/m  If your blood pressure (BP) was elevated on multiple readings during this visit above 130 for the top number or above 80 for the bottom number, please have this repeated by your primary care provider within one month. --------------  Thank you for allowing us to participate in your care today.        Individuals who are confirmed to have, or are being evaluated for, COVID-19 should follow the prevention steps below until a healthcare provider or local or state health department says they can return to normal activities.  Stay home except to get medical  care You should restrict activities outside your home, except for getting medical care. Do not go to work, school, or public areas, and do not use public transportation or taxis.  Call ahead before visiting your doctor Before your medical appointment, call the healthcare provider and tell them that you have, or are being evaluated for, COVID-19 infection. This will help the healthcare providers office take steps to keep other people from getting infected. Ask your healthcare provider to call the local or state health department.  Monitor your symptoms Seek prompt medical attention if your illness is worsening (e.g., difficulty breathing). Before going to your medical appointment, call the healthcare provider and tell them that you have, or are being evaluated for, COVID-19 infection. Ask your healthcare provider to call the local or state health department.  Wear a facemask You should wear a facemask that covers your nose and mouth when you are in the same room with other people and when you visit a healthcare provider. People who live with or visit you should also wear a facemask while they are in the same room with you.  Separate yourself from other people in your home As much as possible,  you should stay in a different room from other people in your home. Also, you should use a separate bathroom, if available.  Avoid sharing household items You should not share dishes, drinking glasses, cups, eating utensils, towels, bedding, or other items with other people in your home. After using these items, you should wash them thoroughly with soap and water.  Cover your coughs and sneezes Cover your mouth and nose with a tissue when you cough or sneeze, or you can cough or sneeze into your sleeve. Throw used tissues in a lined trash can, and immediately wash your hands with soap and water for at least 20 seconds or use an alcohol-based hand rub.  Wash your [employer] Wash your hands often and  thoroughly with soap and water for at least 20 seconds. You can use an alcohol-based hand sanitizer if soap and water are not available and if your hands are not visibly dirty. Avoid touching your eyes, nose, and mouth with unwashed hands.   Prevention Steps for Caregivers and Household Members of Individuals Confirmed to have, or Being Evaluated for, COVID-19 Infection Being Cared for in the Home  If you live with, or provide care at home for, a person confirmed to have, or being evaluated for, COVID-19 infection please follow these guidelines to prevent infection:  Follow healthcare providers instructions Make sure that you understand and can help the patient follow any healthcare provider instructions for all care.  Provide for the patients basic needs You should help the patient with basic needs in the home and provide support for getting groceries, prescriptions, and other personal needs.  Monitor the patients symptoms If they are getting sicker, call his or her medical provider and tell them that the patient has, or is being evaluated for, COVID-19 infection. This will help the healthcare providers office take steps to keep other people from getting infected. Ask the healthcare provider to call the local or state health department.  Limit the number of people who have contact with the patient If possible, have only one caregiver for the patient. Other household members should stay in another home or place of residence. If this is not possible, they should stay in another room, or be separated from the patient as much as possible. Use a separate bathroom, if available. Restrict visitors who do not have an essential need to be in the home.  Keep older adults, very young children, and other sick people away from the patient Keep older adults, very young children, and those who have compromised immune systems or chronic health conditions away from the patient. This includes people  with chronic heart, lung, or kidney conditions, diabetes, and cancer.  Ensure good ventilation Make sure that shared spaces in the home have good air flow, such as from an air conditioner or an opened window, weather permitting.  Wash your hands often Wash your hands often and thoroughly with soap and water for at least 20 seconds. You can use an alcohol based hand sanitizer if soap and water are not available and if your hands are not visibly dirty. Avoid touching your eyes, nose, and mouth with unwashed hands. Use disposable paper towels to dry your hands. If not available, use dedicated cloth towels and replace them when they become wet.  Wear a facemask and gloves Wear a disposable facemask at all times in the room and gloves when you touch or have contact with the patients blood, body fluids, and/or secretions or excretions, such as sweat, saliva, sputum,  nasal mucus, vomit, urine, or feces.  Ensure the mask fits over your nose and mouth tightly, and do not touch it during use. Throw out disposable facemasks and gloves after using them. Do not reuse. Wash your hands immediately after removing your facemask and gloves. If your personal clothing becomes contaminated, carefully remove clothing and launder. Wash your hands after handling contaminated clothing. Place all used disposable facemasks, gloves, and other waste in a lined container before disposing them with other household waste. Remove gloves and wash your hands immediately after handling these items.  Do not share dishes, glasses, or other household items with the patient Avoid sharing household items. You should not share dishes, drinking glasses, cups, eating utensils, towels, bedding, or other items with a patient who is confirmed to have, or being evaluated for, COVID-19 infection. After the person uses these items, you should wash them thoroughly with soap and water.  Wash laundry thoroughly Immediately remove and wash  clothes or bedding that have blood, body fluids, and/or secretions or excretions, such as sweat, saliva, sputum, nasal mucus, vomit, urine, or feces, on them. Wear gloves when handling laundry from the patient. Read and follow directions on labels of laundry or clothing items and detergent. In general, wash and dry with the warmest temperatures recommended on the label.  Clean all areas the individual has used often Clean all touchable surfaces, such as counters, tabletops, doorknobs, bathroom fixtures, toilets, phones, keyboards, tablets, and bedside tables, every day. Also, clean any surfaces that may have blood, body fluids, and/or secretions or excretions on them. Wear gloves when cleaning surfaces the patient has come in contact with. Use a diluted bleach solution (e.g., dilute bleach with 1 part bleach and 10 parts water) or a household disinfectant with a label that says EPA-registered for coronaviruses. To make a bleach solution at home, add 1 tablespoon of bleach to 1 quart (4 cups) of water. For a larger supply, add  cup of bleach to 1 gallon (16 cups) of water. Read labels of cleaning products and follow recommendations provided on product labels. Labels contain instructions for safe and effective use of the cleaning product including precautions you should take when applying the product, such as wearing gloves or eye protection and making sure you have good ventilation during use of the product. Remove gloves and wash hands immediately after cleaning.  Monitor yourself for signs and symptoms of illness Caregivers and household members are considered close contacts, should monitor their health, and will be asked to limit movement outside of the home to the extent possible. Follow the monitoring steps for close contacts listed on the symptom monitoring form.   ? If you have additional questions, contact your local health department or call the epidemiologist on call at  [phone] (available 24/7). ? This guidance is subject to change. For the most up-to-date guidance from [facility], please refer to their website: [email or web address]

## 2018-10-09 NOTE — ED Triage Notes (Signed)
Pt reports sore throat x2 days, congestion, headache and loss of taste. Ex husband exposed, but tested neg. Dtr plays softball so pt is around people.

## 2018-10-10 LAB — NOVEL CORONAVIRUS, NAA (HOSP ORDER, SEND-OUT TO REF LAB; TAT 18-24 HRS): SARS-CoV-2, NAA: NOT DETECTED

## 2020-01-15 ENCOUNTER — Ambulatory Visit (INDEPENDENT_AMBULATORY_CARE_PROVIDER_SITE_OTHER): Payer: 59 | Admitting: Family Medicine

## 2020-01-17 ENCOUNTER — Ambulatory Visit (INDEPENDENT_AMBULATORY_CARE_PROVIDER_SITE_OTHER): Payer: 59 | Admitting: Family Medicine

## 2020-01-31 ENCOUNTER — Ambulatory Visit (INDEPENDENT_AMBULATORY_CARE_PROVIDER_SITE_OTHER): Payer: 59 | Admitting: Family Medicine

## 2020-04-02 ENCOUNTER — Other Ambulatory Visit: Payer: No Typology Code available for payment source

## 2020-04-02 DIAGNOSIS — Z20822 Contact with and (suspected) exposure to covid-19: Secondary | ICD-10-CM

## 2020-04-03 LAB — SARS-COV-2, NAA 2 DAY TAT

## 2020-04-03 LAB — NOVEL CORONAVIRUS, NAA: SARS-CoV-2, NAA: NOT DETECTED

## 2020-04-12 ENCOUNTER — Encounter (INDEPENDENT_AMBULATORY_CARE_PROVIDER_SITE_OTHER): Payer: Self-pay

## 2020-09-16 ENCOUNTER — Encounter (INDEPENDENT_AMBULATORY_CARE_PROVIDER_SITE_OTHER): Payer: Self-pay | Admitting: Family Medicine

## 2020-09-16 ENCOUNTER — Ambulatory Visit (INDEPENDENT_AMBULATORY_CARE_PROVIDER_SITE_OTHER): Payer: No Typology Code available for payment source | Admitting: Family Medicine

## 2020-09-16 ENCOUNTER — Other Ambulatory Visit: Payer: Self-pay

## 2020-09-16 VITALS — BP 115/77 | HR 74 | Temp 97.7°F | Ht 64.0 in | Wt 246.0 lb

## 2020-09-16 DIAGNOSIS — E039 Hypothyroidism, unspecified: Secondary | ICD-10-CM

## 2020-09-16 DIAGNOSIS — Z1331 Encounter for screening for depression: Secondary | ICD-10-CM

## 2020-09-16 DIAGNOSIS — Z9189 Other specified personal risk factors, not elsewhere classified: Secondary | ICD-10-CM

## 2020-09-16 DIAGNOSIS — R0602 Shortness of breath: Secondary | ICD-10-CM | POA: Diagnosis not present

## 2020-09-16 DIAGNOSIS — R5383 Other fatigue: Secondary | ICD-10-CM | POA: Diagnosis not present

## 2020-09-16 DIAGNOSIS — Z789 Other specified health status: Secondary | ICD-10-CM | POA: Insufficient documentation

## 2020-09-16 DIAGNOSIS — E7849 Other hyperlipidemia: Secondary | ICD-10-CM | POA: Diagnosis not present

## 2020-09-16 DIAGNOSIS — E559 Vitamin D deficiency, unspecified: Secondary | ICD-10-CM | POA: Diagnosis not present

## 2020-09-16 DIAGNOSIS — Z6841 Body Mass Index (BMI) 40.0 and over, adult: Secondary | ICD-10-CM

## 2020-09-16 DIAGNOSIS — D649 Anemia, unspecified: Secondary | ICD-10-CM

## 2020-09-16 DIAGNOSIS — Z0289 Encounter for other administrative examinations: Secondary | ICD-10-CM

## 2020-09-16 DIAGNOSIS — R7303 Prediabetes: Secondary | ICD-10-CM

## 2020-09-16 DIAGNOSIS — E7841 Elevated Lipoprotein(a): Secondary | ICD-10-CM | POA: Insufficient documentation

## 2020-09-17 LAB — INSULIN, RANDOM: INSULIN: 13.7 u[IU]/mL (ref 2.6–24.9)
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
## 2020-09-25 NOTE — Progress Notes (Signed)
Dear Misty Stanley, PA-C,   Thank you for referring Bethany Craig to our clinic. The following note includes my evaluation and treatment recommendations.  Chief Complaint:   OBESITY Bethany Craig (MR# [id number]) is a 45 y.o. female who presents for evaluation and treatment of obesity and related comorbidities. Current BMI is Body mass index is 42.23 kg/m. Bethany Craig has been struggling with her weight for many years and has been unsuccessful in either losing weight, maintaining weight loss, or reaching her healthy weight goal.  Bethany Craig is currently in the action stage of change and ready to dedicate time achieving and maintaining a healthier weight. Bethany Craig is interested in becoming our patient and working on intensive lifestyle modifications including (but not limited to) diet and exercise for weight loss.  Bethany Craig is divorced and works 2 jobs at 40+ hours per week.  She has 107, 27, and 30-year-old daughters who live with her.  She tried keto in the past, but it did not help with weight loss.  She gained because she "stopped working out and eating 3-4 smaller meals per day".  Craves cakes/sweets.  Does not eat meat.  She is vegetarian and does not eat dairy, but eats cheese and eggs.  She snacks on chips, cookies, peanut butter, bread.  She recently had FBW with PCP from [facility] 3 weeks ago.  Bethany Craig habits were reviewed today and are as follows: she thinks her family will eat healthier with her, her desired weight loss is 78 pounds, she started gaining excessive weight in 2015, her heaviest weight ever was 256 pounds, she craves sweets (cakes), she skips breakfast frequently, she is trying to follow a vegetarian diet, she is trying to follow a vegan diet, she frequently makes poor food choices, and she struggles with emotional eating.  Depression Screen [name]'s Food and Mood (modified PHQ-9) score was 13.  Depression screen [facility] 2/9 09/16/2020  Decreased Interest 3  Down,  Depressed, Hopeless 2  PHQ - 2 Score 5  Altered sleeping 1  Tired, decreased energy 3  Change in appetite 2  Feeling bad or failure about yourself  1  Trouble concentrating 0  Moving slowly or fidgety/restless 1  Suicidal thoughts 0  PHQ-9 Score 13  Difficult doing work/chores Not difficult at all   Assessment/Plan:   Orders Placed This Encounter  Procedures   Insulin, random   EKG 12-Lead   1. Other fatigue [name] admits to daytime somnolence and reports waking up still tired. Patent has a history of symptoms of daytime fatigue, morning fatigue, morning headache, and snoring. Bethany Craig generally gets 5 hours of sleep per night, and states that she has generally restful sleep. Snoring is present. Apneic episodes are not present. Epworth Sleepiness Score is 9.  Bethany Craig does feel that her weight is causing her energy to be lower than it should be. Fatigue may be related to obesity, depression or many other causes. Labs will be ordered, and in the meanwhile, [name] will focus on self care including making healthy food choices, increasing physical activity and focusing on stress reduction.  - EKG 12-Lead  2. Shortness of breath on exertion [name] notes increasing shortness of breath with exercising and seems to be worsening over time with weight gain. She notes getting out of breath sooner with activity than she used to. This has gotten worse recently. [name] denies shortness of breath at rest or orthopnea.  [name] does feel that she gets out of breath more easily that she used  to when she exercises. [name]'s shortness of breath appears to be obesity related and exercise induced. She has agreed to work on weight loss and gradually increase exercise to treat her exercise induced shortness of breath. Will continue to monitor closely.  3. Prediabetes Not at goal. Goal is HgbA1c < 5.7.  Medication: None.  First diagnosed 3 weeks ago on fasting blood work with PCP.  Plan:  Discussed with  patient that in the future we can consider adding medications to aid in weight loss.  She will focus on the meal plan over the next 2 weeks.  She will continue to focus on protein-rich, low simple carbohydrate foods. We reviewed the importance of hydration, regular exercise for stress reduction, and restorative sleep.  Will check insulin level today.  - Insulin, random  4. Other hyperlipidemia Lipid-lowering medications: None.  Elevated LDL at 117.  Plan: Dietary changes: Increase soluble fiber, decrease simple carbohydrates, decrease saturated fat. Exercise changes: Moderate to vigorous-intensity aerobic activity 150 minutes per week or as tolerated. We will continue to monitor along with PCP/specialists as it pertains to her weight loss journey.  5. Anemia, unspecified type With B12 deficiency - not on supplement.  She is not on an iron supplement, but is taking a multivitamin.  Stable CBC with H&H at 28 & 38.8 ~ 3 weeks ago.  Nutrition: Iron-rich foods include dark leafy greens, red and white meats, eggs, seafood, and beans.  Certain foods and drinks prevent your body from absorbing iron properly. Avoid eating these foods in the same meal as iron-rich foods or with iron supplements. These foods include: coffee, black tea, and red wine; milk, dairy products, and foods that are high in calcium; beans and soybeans; whole grains. Constipation can be a side effect of iron supplementation. Increased water and fiber intake are helpful. Water goal: > 2 liters/day. Fiber goal: > 25 grams/day.  CBC Latest Ref Rng & Units 12/07/2013 05/16/2008  WBC 4.0 - 10.5 K/uL 6.3 8.5  Hemoglobin 12.0 - 15.0 g/dL 01.7 49.4  Hematocrit 49.6 - 46.0 % 36.9 40.0  Platelets 150 - 400 K/uL 357 306   6. Hypothyroidism, unspecified type Medication: None currently.  TSH 16.8 in 07/2019.  Took medication for 3 months and then stopped.  Plan: Patient was instructed not to take MVM or iron within 4 hours of taking thyroid  medications.  We will continue to monitor alongside Endocrinology/PCP as it relates to her weight loss journey.   7. Vitamin D deficiency Not at goal.  Vitamin D level 29.0 recently.  She has always had vitamin D deficiency.  She is taking OTC vitamin D 5,000 IU daily.  She increased her OTC dose by 2,000 units to 5,000 IU per day.  Plan: Continue current OTC vitamin D supplementation.  Recheck vitamin D level in ~3 months.  8. Depression screening Bethany Craig was screened for depression as part of her new patient workup today.  PHQ-9 is 13.  Bethany Craig had a positive depression screening. Depression is commonly associated with obesity and often results in emotional eating behaviors. We will monitor this closely and work on CBT to help improve the non-hunger eating patterns. Referral to Psychology may be required if no improvement is seen as she continues in our clinic.  9. At risk for impaired metabolic function Due to Bethany Craig's current state of health and medical condition(s), she is at a significantly higher risk for impaired metabolic function.  At least 28 minutes was spent on counseling Kinze about these  concerns today.  This places the patient at a much greater risk to subsequently develop cardio-pulmonary conditions that can negatively affect the patient's quality of life.  I stressed the importance of reversing these risks factors.  The initial goal is to lose at least 5-10% of starting weight to help reduce risk factors.  Counseling:  Intensive lifestyle modifications discussed with Bethany Craig as the most appropriate first line treatment.  she will continue to work on diet, exercise, and weight loss efforts.  We will continue to reassess these conditions on a fairly regular basis in an attempt to decrease the patient's overall morbidity and mortality.  10. Class 3 severe obesity with serious comorbidity and body mass index (BMI) of 40.0 to 44.9 in adult, unspecified obesity type [facility])  Bethany Craig is  currently in the action stage of change and her goal is to continue with weight loss efforts. I recommend Bethany Craig begin the structured treatment plan as follows:  She has agreed to the Vegetarian Plan.  Exercise goals:  As is.    Behavioral modification strategies: increasing lean protein intake, decreasing simple carbohydrates, and planning for success.  She was informed of the importance of frequent follow-up visits to maximize her success with intensive lifestyle modifications for her multiple health conditions. She was informed we would discuss her lab results at her next visit unless there is a critical issue that needs to be addressed sooner. Bethany Craig agreed to keep her next visit at the agreed upon time to discuss these results.  Objective:   Blood pressure 115/77, pulse 74, temperature 97.7 F (36.5 C), height 5\' 4"  (1.626 m), weight 246 lb (111.6 kg), SpO2 97 %. Body mass index is 42.23 kg/m.  EKG: Normal sinus rhythm, rate 80 bpm.  Indirect Calorimeter completed today shows a VO2 of 194 and a REE of 1339.  Her calculated basal metabolic rate is thus her basal metabolic rate is worse than expected.  General: Cooperative, alert, well developed, in no acute distress. HEENT: Conjunctivae and lids unremarkable. Cardiovascular: Regular rhythm.  Lungs: Normal work of breathing. Neurologic: No focal deficits.   Lab Results  Component Value Date   CREATININE 0.63 12/07/2013   BUN 14 12/07/2013   NA 139 12/07/2013   K 3.8 12/07/2013   CL 103 12/07/2013   CO2 25 12/07/2013   Lab Results  Component Value Date   INSULIN 13.7 09/16/2020   Lab Results  Component Value Date   WBC 6.3 12/07/2013   HGB 12.8 12/07/2013   HCT 36.9 12/07/2013   MCV 88.5 12/07/2013   PLT 357 12/07/2013   Attestation Statements:   This is the patient's first visit at [facility]. The patient's NEW PATIENT PACKET was reviewed at length. Included in the packet: current and past  health history, medications, allergies, ROS, gynecologic history (women only), surgical history, family history, social history, weight history, weight loss surgery history (for those that have had weight loss surgery), nutritional evaluation, mood and food questionnaire, PHQ9, Epworth questionnaire, sleep habits questionnaire, patient life and health improvement goals questionnaire. These will all be scanned into the patient's chart under media.   During the visit, I independently reviewed the patient's EKG, bioimpedance scale results, and indirect calorimeter results. I used this information to tailor a meal plan for the patient that will help her to lose weight and will improve her obesity-related conditions going forward. I performed a medically necessary appropriate examination and/or evaluation. I discussed the assessment and treatment plan with the patient.  The patient was provided an opportunity to ask questions and all were answered. The patient agreed with the plan and demonstrated an understanding of the instructions. Labs were ordered at this visit and will be reviewed at the next visit unless more critical results need to be addressed immediately. Clinical information was updated and documented in the EMR.   I, Insurance claims handler, CMA, am acting as Energy manager for [employer], DO.  I have reviewed the above documentation for accuracy and completeness, and I agree with the above. Carlye Grippe, D.O.  The 21st Century Cures Act was signed into law in 2016 which includes the topic of electronic health records.  This provides immediate access to information in MyChart.  This includes consultation notes, operative notes, office notes, lab results and pathology reports.  If you have any questions about what you read please let us know at your next visit so we can discuss your concerns and take corrective action if need be.  We are right here with you.

## 2020-09-30 ENCOUNTER — Encounter (INDEPENDENT_AMBULATORY_CARE_PROVIDER_SITE_OTHER): Payer: Self-pay | Admitting: Family Medicine

## 2020-09-30 ENCOUNTER — Other Ambulatory Visit: Payer: Self-pay

## 2020-09-30 ENCOUNTER — Ambulatory Visit (INDEPENDENT_AMBULATORY_CARE_PROVIDER_SITE_OTHER): Payer: No Typology Code available for payment source | Admitting: Family Medicine

## 2020-09-30 VITALS — BP 142/83 | HR 75 | Temp 98.2°F | Ht 64.0 in | Wt 239.0 lb

## 2020-09-30 DIAGNOSIS — R7303 Prediabetes: Secondary | ICD-10-CM

## 2020-09-30 DIAGNOSIS — Z9189 Other specified personal risk factors, not elsewhere classified: Secondary | ICD-10-CM | POA: Diagnosis not present

## 2020-09-30 DIAGNOSIS — E559 Vitamin D deficiency, unspecified: Secondary | ICD-10-CM

## 2020-09-30 DIAGNOSIS — E7849 Other hyperlipidemia: Secondary | ICD-10-CM

## 2020-09-30 DIAGNOSIS — K5900 Constipation, unspecified: Secondary | ICD-10-CM

## 2020-09-30 DIAGNOSIS — D508 Other iron deficiency anemias: Secondary | ICD-10-CM

## 2020-09-30 DIAGNOSIS — Z6841 Body Mass Index (BMI) 40.0 and over, adult: Secondary | ICD-10-CM

## 2020-09-30 DIAGNOSIS — E538 Deficiency of other specified B group vitamins: Secondary | ICD-10-CM

## 2020-10-06 ENCOUNTER — Encounter (INDEPENDENT_AMBULATORY_CARE_PROVIDER_SITE_OTHER): Payer: Self-pay | Admitting: Family Medicine

## 2020-10-06 ENCOUNTER — Telehealth (INDEPENDENT_AMBULATORY_CARE_PROVIDER_SITE_OTHER): Payer: No Typology Code available for payment source | Admitting: Psychology

## 2020-10-06 DIAGNOSIS — F5089 Other specified eating disorder: Secondary | ICD-10-CM

## 2020-10-06 MED ORDER — POLYETHYLENE GLYCOL 3350 17 GM/SCOOP PO POWD: ORAL | 0 refills | Status: DC

## 2020-10-06 NOTE — Progress Notes (Signed)
  Office: [phone]  /  Fax: [phone]    Date: October 20, 2020   Appointment Start Time: 2:02pm Duration: 23 minutes Provider: Lawerance Cruel, Psy.D. Type of Session: Individual Therapy  Location of Patient: Work Theatre manager: Provider's home (private office) Type of Contact: Telepsychological Visit via MyChart Video Visit  Session Content: Bethany Craig is a 45 y.o. female presenting for a follow-up appointment to address the previously established treatment goal of increasing coping skills. Today's appointment was a telepsychological visit due to COVID-19. Bethany Craig provided verbal consent for today's telepsychological appointment and she is aware she is responsible for securing confidentiality on her end of the session. Prior to proceeding with today's appointment, Bethany Craig's physical location at the time of this appointment was obtained as well a phone number she could be reached at in the event of technical difficulties. Bethany Craig and this provider participated in today's telepsychological service.   This provider conducted a brief check-in. Bethany Craig discussed deviations from her structured meal plan; however, discussed she was able to get right back on track. This was further explored and she acknowledged she ate out of convenience. She was encouraged to discuss options when having to eat out during her next visit with Dr. Sharee Craig on August 9th; she agreed. Emotional and physical hunger were reviewed. Psychoeducation regarding triggers for emotional eating was provided. Bethany Craig was provided a handout, and encouraged to utilize the handout between now and the next appointment to increase awareness of triggers and frequency. Bethany Craig agreed. This provider also discussed behavioral strategies for specific triggers, such as placing the utensil down when conversing to avoid mindless eating. Bethany Craig provided verbal consent during today's appointment for this provider to send a handout about triggers  via e-mail. Bethany Craig was receptive to today's appointment as evidenced by openness to sharing, responsiveness to feedback, and willingness to explore triggers for emotional eating.  Mental Status Examination:  Appearance: well groomed and appropriate hygiene  Behavior: appropriate to circumstances Mood: euthymic Affect: mood congruent Speech: normal in rate, volume, and tone Eye Contact: appropriate Psychomotor Activity: unable to assess Gait: unable to assess Thought Process: linear, logical, and goal directed  Thought Content/Perception: no hallucinations, delusions, bizarre thinking or behavior reported or observed and no evidence or endorsement of suicidal and homicidal ideation, plan, and intent Orientation: time, person, place, and purpose of appointment Memory/Concentration: memory, attention, language, and fund of knowledge intact  Insight/Judgment: fair  Interventions:  Conducted a brief chart review Provided empathic reflections and validation Reviewed content from the previous session Processed thoughts and feelings Psychoeducation provided regarding triggers for emotional eating Employed supportive psychotherapy interventions to facilitate reduced distress, and to improve coping skills with identified stressors  DSM-5 Diagnosis(es): F50.89 Other Specified Feeding or Eating Disorder, Emotional Eating Behaviors  Treatment Goal & Progress: During the initial appointment with this provider, the following treatment goal was established: increase coping skills. Bethany Craig has some demonstrated progress in her goal as evidenced by increased awareness of hunger patterns.   Plan: The next appointment will be scheduled in approximately two weeks, which will be via MyChart Video Visit. The next session will focus on working towards the established treatment goal.

## 2020-10-06 NOTE — Telephone Encounter (Signed)
Please advise 

## 2020-10-13 NOTE — Progress Notes (Signed)
Chief Complaint:   OBESITY Bethany Craig is here to discuss her progress with her obesity treatment plan along with follow-up of her obesity related diagnoses.   Today's visit was #: 2 Starting weight: 246 lbs Starting date: 09/16/2020 Today's weight: 239 lbs Today's date: 09/30/2020 Weight change since last visit: 7 lbs Total lbs lost to date: 7 lbs Body mass index is 41.02 kg/m.  Total weight loss percentage to date: -2.85%  Interim History:  Bethany Craig is here today for her first follow-up office visit since starting the program with [place].  All blood work/ lab tests that were recently ordered by myself or an outside provider were reviewed with patient today per their request.   Extended time was spent counseling her on all new disease processes that were discovered or preexisting ones that are worsening.  she understands that many of these abnormalities will need to monitored regularly along with the current treatment plan of prudent dietary changes, in which we are making each and every office visit, to improve these health parameters.  Bethany Craig is here for a follow up office visit.  We reviewed her meal plan and questions were answered.  Patient's food recall appears to be accurate and consistent with what is on plan when she is following it.   When eating on plan, her hunger and cravings are well controlled.    Bethany Craig says she has much less swelling in her legs and less bloating in her stomach.  Drinks two 64 ounce waters at work plus additional bottles of water in the morning and evening, occasionally.  We went over labs from PCP at [facility] on 08/25/2020 including CBC, CMP, FLP, TSH, A1c, B12, folate, and vitamin D.  Current Meal Plan: the Vegetarian Plan for 100% of the time.  Current Exercise Plan: Waking for 60 minutes 2 times per week.  Assessment/Plan:   Medications Discontinued During This Encounter  Medication Reason   ibuprofen (ADVIL,MOTRIN) 800 MG tablet  Patient Preference   naproxen (NAPROSYN) 500 MG tablet Patient Preference   Meds ordered this encounter  Medications   polyethylene glycol powder (GLYCOLAX/MIRALAX) 17 GM/SCOOP powder    Sig: May use once or twice daily until normal stools, then prn constipation    Dispense:  116 g    Refill:  0    Prescribe smallest bottle please - 116 gr appears to be that    1. Prediabetes Not at goal. Goal is HgbA1c < 5.7.  Medication: None.   First diagnosed about 1 month ago.  A1c 5.9.  No cravings for carbs or sweets.  Plan:  Discussed labs with patient today.  She will continue to focus on protein-rich, low simple carbohydrate foods. We reviewed the importance of hydration, regular exercise for stress reduction, and restorative sleep.   Lab Results  Component Value Date   INSULIN 13.7 09/16/2020   2. Other hyperlipidemia Lipid-lowering medications: None.    Plan:  Discussed labs with patient today.  Dietary changes: Increase soluble fiber, decrease simple carbohydrates, decrease saturated fat. Exercise changes: Moderate to vigorous-intensity aerobic activity 150 minutes per week or as tolerated. We will continue to monitor along with PCP/specialists as it pertains to her weight loss journey.  Follow prudent nutritional plan.    3. Other iron deficiency anemia Vegetarian diet.  No supplements.  No need for iron supplement at this time.  Plan:  Discussed labs with patient today.  Continue prudent nutritional plan.  Will monitor.  Continue current  treatment plan.  Nutrition: Iron-rich foods include dark leafy greens, red and white meats, eggs, seafood, and beans.  Certain foods and drinks prevent your body from absorbing iron properly. Avoid eating these foods in the same meal as iron-rich foods or with iron supplements. These foods include: coffee, black tea, and red wine; milk, dairy products, and foods that are high in calcium; beans and soybeans; whole grains. Constipation can be a side effect  of iron supplementation. Increased water and fiber intake are helpful. Water goal: > 2 liters/day. Fiber goal: > 25 grams/day.  CBC Latest Ref Rng & Units 12/07/2013 05/16/2008  WBC 4.0 - 10.5 K/uL 6.3 8.5  Hemoglobin 12.0 - 15.0 g/dL 40.9 81.1  Hematocrit 91.4 - 46.0 % 36.9 40.0  Platelets 150 - 400 K/uL 357 306   4. Vitamin D deficiency Not at goal.  She is taking OTC vitamin D 5,000 IU daily, started by PCP.  Doing well.  Plan:  Discussed labs with patient today.  Continue current OTC vitamin D supplementation.  Recheck in 3 months.  Counseling done on importance of vitamin D supplement.  5. Vitamin B 12 deficiency Supplementation: 5,000 mcg daily.  Asymptomatic.  Energy improved since on supplement.  Plan:  Discussed labs with patient today.  Decrease dose of B12 supplement by half of current dose.    6. Constipation, unspecified constipation type This problem is poorly controlled. Bethany Craig was informed that a decrease in bowel movement frequency is normal while losing weight, but stools should not be hard or painful.  Counseling: Getting to Good Bowel Health: Your goal is to have one soft bowel movement each day. Drink at least 8 glasses of water each day. Eat plenty of fiber (goal is over 30 grams each day). It is best to get most of your fiber from dietary sources which includes leafy green vegetables, fresh fruit, and whole grains. You may need to add fiber with the help of OTC fiber supplements. These include Metamucil, Citrucel, and Benefiber. If you are still having trouble, try adding an osmotic laxative such as Miralax. If all of these changes do not work, Dietitian.   - Start polyethylene glycol powder (GLYCOLAX/MIRALAX) 17 GM/SCOOP powder; May use once or twice daily until normal stools, then prn constipation  Dispense: 116 g; Refill: 0  7. At risk for impaired metabolic function Due to Bethany Craig's current state of health and medical condition(s), she is at a  significantly higher risk for impaired metabolic function.   At least 23 minutes was spent on counseling Bethany Craig about these concerns today.  This places the patient at a much greater risk to subsequently develop cardio-pulmonary conditions that can negatively affect the patient's quality of life.  I stressed the importance of reversing these risks factors.  The initial goal is to lose at least 5-10% of starting weight to help reduce risk factors.  Counseling:  Intensive lifestyle modifications discussed with Bethany Craig as the most appropriate first line treatment.  she will continue to work on diet, exercise, and weight loss efforts.  We will continue to reassess these conditions on a fairly regular basis in an attempt to decrease the patient's overall morbidity and mortality.  8. Class 3 severe obesity with serious comorbidity and body mass index (BMI) of 40.0 to 44.9 in adult, unspecified obesity type [facility])  Course: Bethany Craig is currently in the action stage of change. As such, her goal is to continue with weight loss efforts.   Nutrition goals: She has agreed  to the Vegetarian Plan.   Exercise goals:  As is.  Behavioral modification strategies: increasing lean protein intake, decreasing simple carbohydrates, and planning for success.  Bethany Craig has agreed to follow-up with our clinic in 2 weeks with William Hamburger, NP and in 4-5 weeks with me if desired. She was informed of the importance of frequent follow-up visits to maximize her success with intensive lifestyle modifications for her multiple health conditions.   Objective:   Blood pressure (!) 142/83, pulse 75, temperature 98.2 F (36.8 C), height 5\' 4"  (1.626 m), weight 239 lb (108.4 kg), last menstrual period 09/28/2020, SpO2 98 %. Body mass index is 41.02 kg/m.  General: Cooperative, alert, well developed, in no acute distress. HEENT: Conjunctivae and lids unremarkable. Cardiovascular: Regular rhythm.  Lungs: Normal work of  breathing. Neurologic: No focal deficits.   Lab Results  Component Value Date   CREATININE 0.63 12/07/2013   BUN 14 12/07/2013   NA 139 12/07/2013   K 3.8 12/07/2013   CL 103 12/07/2013   CO2 25 12/07/2013   Lab Results  Component Value Date   INSULIN 13.7 09/16/2020   Lab Results  Component Value Date   WBC 6.3 12/07/2013   HGB 12.8 12/07/2013   HCT 36.9 12/07/2013   MCV 88.5 12/07/2013   PLT 357 12/07/2013   Attestation Statements:   Reviewed by clinician on day of visit: allergies, medications, problem list, medical history, surgical history, family history, social history, and previous encounter notes.  I, 12/09/2013, CMA, am acting as Insurance claims handler for Energy manager, DO.  I have reviewed the above documentation for accuracy and completeness, and I agree with the above. [employer], D.O.  The 21st Century Cures Act was signed into law in 2016 which includes the topic of electronic health records.  This provides immediate access to information in MyChart.  This includes consultation notes, operative notes, office notes, lab results and pathology reports.  If you have any questions about what you read please let 2017 know at your next visit so we can discuss your concerns and take corrective action if need be.  We are right here with you.

## 2020-10-14 ENCOUNTER — Encounter (INDEPENDENT_AMBULATORY_CARE_PROVIDER_SITE_OTHER): Payer: Self-pay | Admitting: Adult Health

## 2020-10-14 ENCOUNTER — Other Ambulatory Visit: Payer: Self-pay

## 2020-10-14 ENCOUNTER — Telehealth (INDEPENDENT_AMBULATORY_CARE_PROVIDER_SITE_OTHER): Payer: No Typology Code available for payment source | Admitting: Adult Health

## 2020-10-14 DIAGNOSIS — K5900 Constipation, unspecified: Secondary | ICD-10-CM | POA: Diagnosis not present

## 2020-10-14 DIAGNOSIS — Z6841 Body Mass Index (BMI) 40.0 and over, adult: Secondary | ICD-10-CM | POA: Diagnosis not present

## 2020-10-14 DIAGNOSIS — R7303 Prediabetes: Secondary | ICD-10-CM | POA: Diagnosis not present

## 2020-10-15 NOTE — Telephone Encounter (Signed)
Were you able to send options? Please advise.

## 2020-10-15 NOTE — Telephone Encounter (Signed)
Last OV with Katy 

## 2020-10-16 DIAGNOSIS — Z6841 Body Mass Index (BMI) 40.0 and over, adult: Secondary | ICD-10-CM | POA: Insufficient documentation

## 2020-10-16 DIAGNOSIS — K5900 Constipation, unspecified: Secondary | ICD-10-CM | POA: Insufficient documentation

## 2020-10-16 NOTE — Progress Notes (Signed)
TeleHealth Visit:  Due to the COVID-19 pandemic, this visit was completed with telemedicine (audio/video) technology to reduce patient and provider exposure as well as to preserve personal protective equipment.   Bethany Craig has verbally consented to this TeleHealth visit. The patient is located at home, the provider is located at the [facility] [facility] office. The participants in this visit include the listed provider and patient. The visit was conducted today via video.   Chief Complaint: OBESITY Bethany Craig is here to discuss her progress with her obesity treatment plan along with follow-up of her obesity related diagnoses. Bethany Craig is on the Vegetarian Plan and states she is following her eating plan approximately 80% of the time. Bethany Craig states she is walking 30-45 minutes 2-3 times per week.  Today's visit was #: 3 Starting weight: 246 lbs Starting date: 09/16/2020  Interim History: Bethany Craig experienced right ear ache and pharyngitis for over a week and is treating with OTC remedies.  She was tested at [facility]- rapid Antigen test: negative She is fully vaccinated with Pfizer, but not boosted. She denies known exposure to COVID-19. Of note: previously vegan but now consuming eggs.  Subjective:   1. Constipation, unspecified constipation type Lashante started Miralax on Friday 10 October 2020, 1 to 2 times a day. She reports BM after use of Miralax and denies abdominal pain or hematochezia.  2. Pre-diabetes Bethany Craig's last A1c was 5.9.  She is not on any BG lowering medications.  Assessment/Plan:   1. Constipation, unspecified constipation type [name] will continue increasing water intake, fiber, OTC Miralax, and regular exercise. She was informed that a decrease in bowel movement frequency is normal while losing weight, but stools should not be hard or painful. Orders and follow up as documented in patient record.   Counseling Getting to Good Bowel Health: Your goal is to have  one soft bowel movement each day. Drink at least 8 glasses of water each day. Eat plenty of fiber (goal is over 25 grams each day). It is best to get most of your fiber from dietary sources which includes leafy green vegetables, fresh fruit, and whole grains. You may need to add fiber with the help of OTC fiber supplements. These include Metamucil, Citrucel, and Flaxseed. If you are still having trouble, try adding Miralax or Magnesium Citrate. If all of these changes do not work, Dietitian.  2. Pre-diabetes Bethany Craig will increase protein and regular walking. She will continue to work on weight loss, exercise, and decreasing simple carbohydrates to help decrease the risk of diabetes. We will continue to monitor labs.  3. Class 3 severe obesity with serious comorbidity and body mass index (BMI) of 40.0 to 44.9 in adult, unspecified obesity type Bethany Craig)  Bethany Craig is currently in the action stage of change. As such, her goal is to continue with weight loss efforts. She has agreed to the Vegetarian Plan.   Recipe Guide II sent via MyChart.  Exercise goals:  As is  Behavioral modification strategies: increasing lean protein intake, decreasing simple carbohydrates, increasing high fiber foods, meal planning and cooking strategies, keeping healthy foods in the home, and planning for success.  Bethany Craig has agreed to follow-up with our clinic in 2 weeks. She was informed of the importance of frequent follow-up visits to maximize her success with intensive lifestyle modifications for her multiple health conditions.  Objective:   VITALS: Per patient if applicable, see vitals. GENERAL: Alert and in no acute distress. CARDIOPULMONARY: No increased WOB. Speaking in clear  sentences.  PSYCH: Pleasant and cooperative. Speech normal rate and rhythm. Affect is appropriate. Insight and judgement are appropriate. Attention is focused, linear, and appropriate.  NEURO: Oriented as arrived to appointment on  time with no prompting.   Lab Results  Component Value Date   CREATININE 0.63 12/07/2013   BUN 14 12/07/2013   NA 139 12/07/2013   K 3.8 12/07/2013   CL 103 12/07/2013   CO2 25 12/07/2013   No results found for: ALT, AST, GGT, ALKPHOS, BILITOT No results found for: HGBA1C Lab Results  Component Value Date   INSULIN 13.7 09/16/2020   No results found for: TSH No results found for: CHOL, HDL, LDLCALC, LDLDIRECT, TRIG, CHOLHDL No results found for: DI26EB Lab Results  Component Value Date   WBC 6.3 12/07/2013   HGB 12.8 12/07/2013   HCT 36.9 12/07/2013   MCV 88.5 12/07/2013   PLT 357 12/07/2013   No results found for: IRON, TIBC, FERRITIN  Attestation Statements:   Reviewed by clinician on day of visit: allergies, medications, problem list, medical history, surgical history, family history, social history, and previous encounter notes.  Time spent on visit including pre-visit chart review and post-visit charting and care was 26 minutes.   Bethany Craig, CMA, am acting as transcriptionist for Bethany Hamburger, NP.  I have reviewed the above documentation for accuracy and completeness, and I agree with the above. - Bethany Craig d. Bethany Latino, NP-C

## 2020-10-20 ENCOUNTER — Telehealth (INDEPENDENT_AMBULATORY_CARE_PROVIDER_SITE_OTHER): Payer: No Typology Code available for payment source | Admitting: Psychology

## 2020-10-20 DIAGNOSIS — F5089 Other specified eating disorder: Secondary | ICD-10-CM | POA: Diagnosis not present

## 2020-10-21 NOTE — Progress Notes (Signed)
  Office: [phone]  /  Fax: [phone]    Date: November 04, 2020   Appointment Start Time: 2:01pm Duration: 27 minutes Provider: Lawerance Cruel, Psy.D. Type of Session: Individual Therapy  Location of Patient: Work (private room) Location of Provider: Provider's home (private office) Type of Contact: Telepsychological Visit via MyChart Video Visit  Session Content: Bethany Craig is a 45 y.o. female presenting for a follow-up appointment to address the previously established treatment goal of increasing coping skills. Today's appointment was a telepsychological visit due to COVID-19. Bethany Craig provided verbal consent for today's telepsychological appointment and she is aware she is responsible for securing confidentiality on her end of the session. Prior to proceeding with today's appointment, Bethany Craig's physical location at the time of this appointment was obtained as well a phone number she could be reached at in the event of technical difficulties. Bethany Craig and this provider participated in today's telepsychological service.   This provider conducted a brief check-in. Bethany Craig stated, "It's been a very busy week." Reviewed triggers for emotional eating behaviors. She recalled craving sweets when feeling tired and bored, noting she made better choices as she was "mindful." She described an overall reduction in emotional eating behaviors. Positive reinforcement was provided. Psychoeducation regarding mindfulness was provided. A handout was provided to Bethany Craig with further information regarding mindfulness, including exercises. This provider also explained the benefit of mindfulness as it relates to emotional eating. Bethany Craig was encouraged to engage in the provided exercises between now and the next appointment with this provider. Bethany Craig agreed. During today's appointment, Bethany Craig was led through a mindfulness exercise involving her senses. Bethany Craig provided verbal consent during today's appointment for this  provider to send a handout about mindfulness via e-mail. Bethany Craig was receptive to today's appointment as evidenced by openness to sharing, responsiveness to feedback, and willingness to engage in mindfulness exercises to assist with coping.   Mental Status Examination:  Appearance: well groomed and appropriate hygiene  Behavior: appropriate to circumstances Mood: euthymic Affect: mood congruent Speech: normal in rate, volume, and tone Eye Contact: appropriate Psychomotor Activity: unable to assess Gait: unable to assess Thought Process: linear, logical, and goal directed  Thought Content/Perception: no hallucinations, delusions, bizarre thinking or behavior reported or observed and no evidence or endorsement of suicidal and homicidal ideation, plan, and intent Orientation: time, person, place, and purpose of appointment Memory/Concentration: memory, attention, language, and fund of knowledge intact  Insight/Judgment: good  Interventions:  Conducted a brief chart review Provided empathic reflections and validation Reviewed content from the previous session Processed thoughts and feelings Psychoeducation provided regarding mindfulness Engaged patient in a mindfulness exercise Provided positive reinforcement Employed supportive psychotherapy interventions to facilitate reduced distress, and to improve coping skills with identified stressors  DSM-5 Diagnosis(es): F50.89 Other Specified Feeding or Eating Disorder, Emotional Eating Behaviors  Treatment Goal & Progress: During the initial appointment with this provider, the following treatment goal was established: increase coping skills. Bethany Craig has demonstrated progress in her goal as evidenced by increased awareness of hunger patterns, increased awareness of triggers for emotional eating behaviors, and reduction in emotional eating behaviors . Bethany Craig also demonstrates willingness to engage in mindfulness exercises.  Plan: Based on  Bethany Craig's progress per her self-report and appointment availability/Bethany Craig's schedule, the next appointment will be scheduled in one month, which will be via MyChart Video Visit. The next session will focus on working towards the established treatment goal.

## 2020-10-28 ENCOUNTER — Ambulatory Visit (INDEPENDENT_AMBULATORY_CARE_PROVIDER_SITE_OTHER): Payer: No Typology Code available for payment source | Admitting: Family Medicine

## 2020-10-28 ENCOUNTER — Encounter (INDEPENDENT_AMBULATORY_CARE_PROVIDER_SITE_OTHER): Payer: Self-pay | Admitting: Family Medicine

## 2020-10-28 ENCOUNTER — Other Ambulatory Visit: Payer: Self-pay

## 2020-10-28 VITALS — BP 124/83 | HR 69 | Temp 97.7°F | Ht 64.0 in | Wt 236.0 lb

## 2020-10-28 DIAGNOSIS — E559 Vitamin D deficiency, unspecified: Secondary | ICD-10-CM | POA: Diagnosis not present

## 2020-10-28 DIAGNOSIS — Z6841 Body Mass Index (BMI) 40.0 and over, adult: Secondary | ICD-10-CM | POA: Diagnosis not present

## 2020-10-28 DIAGNOSIS — K5900 Constipation, unspecified: Secondary | ICD-10-CM

## 2020-10-28 DIAGNOSIS — E66813 Obesity, class 3: Secondary | ICD-10-CM

## 2020-10-29 NOTE — Progress Notes (Signed)
Chief Complaint:   OBESITY Bethany Craig is here to discuss her progress with her obesity treatment plan along with follow-up of her obesity related diagnoses. Bethany Craig is on the [place] and states she is following her eating plan approximately 90% of the time. Bethany Craig states she is doing 0 minutes 0 times per week.  Today's visit was #: 4 Starting weight: 246 lbs Starting date: 09/16/2020 Today's weight: 236 lbs Today's date: 10/28/2020 Total lbs lost to date: 10 Total lbs lost since last in-office visit: 3  Interim History: Bethany Craig met with Dr. Mallie Mussel, and she notes it helped a lot with emotions and eating. She needs ideas for eating out and what to eat. She denies hunger or cravings. She states she wasn't "perfect" per patient, but she is happy with her progress.  Subjective:   1. Constipation, unspecified constipation type Bethany Craig started miralax. She is currently in the midst of bowel prep for a colonoscopy tomorrow.   2. Vitamin D deficiency Bethany Craig is currently taking OTC vitamin D 5,000 IU each day, and she is tolerating it well. No issues with the supplementation. She denies nausea, vomiting or muscle weakness.  Assessment/Plan:   1. Constipation, unspecified constipation type Bethany Craig's symptoms are resolved currently. She will continue her treatment as needed and increase her water intake. She was informed that a decrease in bowel movement frequency is normal while losing weight, but stools should not be hard or painful. Orders and follow up as documented in patient record.   Counseling Getting to Good Bowel Health: Your goal is to have one soft bowel movement each day. Drink at least 8 glasses of water each day. Eat plenty of fiber (goal is over 25 grams each day). It is best to get most of your fiber from dietary sources which includes leafy green vegetables, fresh fruit, and whole grains. You may need to add fiber with the help of OTC fiber supplements. These include  Metamucil, Citrucel, and Flaxseed. If you are still having trouble, try adding Miralax or Magnesium Citrate. If all of these changes do not work, Cabin crew.  2. Vitamin D deficiency Low Vitamin D level contributes to fatigue and are associated with obesity, breast, and colon cancer. Bethany Craig will continue OTC Vitamin D 5,000 IU daily and will follow-up for routine testing of Vitamin D, at least 2-3 times per year to avoid over-replacement.  3. Obesity with current BMI 40.5 Bethany Craig is currently in the action stage of change. As such, her goal is to continue with weight loss efforts. She has agreed to the Between.   I discussed with the patient eating out options.   Exercise goals: As is.  Behavioral modification strategies: better snacking choices, emotional eating strategies, and avoiding temptations.  Bethany Craig has agreed to follow-up with our clinic in 2 to 3 weeks with Mina Marble, NP. She was informed of the importance of frequent follow-up visits to maximize her success with intensive lifestyle modifications for her multiple health conditions.   Objective:   Blood pressure 124/83, pulse 69, temperature 97.7 F (36.5 C), height 5' 4"  (1.626 m), weight 236 lb (107 kg), last menstrual period 09/28/2020, SpO2 97 %. Body mass index is 40.51 kg/m.  General: Cooperative, alert, well developed, in no acute distress. HEENT: Conjunctivae and lids unremarkable. Cardiovascular: Regular rhythm.  Lungs: Normal work of breathing. Neurologic: No focal deficits.   Lab Results  Component Value Date   CREATININE 0.63 12/07/2013   BUN 14 12/07/2013  NA 139 12/07/2013   K 3.8 12/07/2013   CL 103 12/07/2013   CO2 25 12/07/2013   No results found for: ALT, AST, GGT, ALKPHOS, BILITOT No results found for: HGBA1C Lab Results  Component Value Date   INSULIN 13.7 09/16/2020   No results found for: TSH No results found for: CHOL, HDL, LDLCALC, LDLDIRECT, TRIG, CHOLHDL No  results found for: VD25OH Lab Results  Component Value Date   WBC 6.3 12/07/2013   HGB 12.8 12/07/2013   HCT 36.9 12/07/2013   MCV 88.5 12/07/2013   PLT 357 12/07/2013   No results found for: IRON, TIBC, FERRITIN  Attestation Statements:   Reviewed by clinician on day of visit: allergies, medications, problem list, medical history, surgical history, family history, social history, and previous encounter notes.  Time spent on visit including pre-visit chart review and post-visit care and charting was 24 minutes.    Wilhemena Durie, am acting as transcriptionist for [employer], DO.  I have reviewed the above documentation for accuracy and completeness, and I agree with the above. Marjory Sneddon, D.O.  The [place] was signed into law in 2016 which includes the topic of electronic health records.  This provides immediate access to information in MyChart.  This includes consultation notes, operative notes, office notes, lab results and pathology reports.  If you have any questions about what you read please let us know at your next visit so we can discuss your concerns and take corrective action if need be.  We are right here with you.

## 2020-11-04 ENCOUNTER — Telehealth (INDEPENDENT_AMBULATORY_CARE_PROVIDER_SITE_OTHER): Payer: No Typology Code available for payment source | Admitting: Psychology

## 2020-11-04 DIAGNOSIS — F5089 Other specified eating disorder: Secondary | ICD-10-CM | POA: Diagnosis not present

## 2020-11-18 ENCOUNTER — Ambulatory Visit (INDEPENDENT_AMBULATORY_CARE_PROVIDER_SITE_OTHER): Payer: No Typology Code available for payment source | Admitting: Adult Health

## 2020-11-18 NOTE — Progress Notes (Unsigned)
  Office: [phone]  /  Fax: [phone]    Date: December 02, 2020   Appointment Start Time: *** Duration: *** minutes Provider: Lawerance Cruel, Psy.D. Type of Session: Individual Therapy  Location of Patient: {gbptloc:23249} Location of Provider: Provider's Home (private office) Type of Contact: Telepsychological Visit via MyChart Video Visit  Session Content: Bethany Craig is a 45 y.o. female presenting for a follow-up appointment to address the previously established treatment goal of increasing coping skills.Today's appointment was a telepsychological visit due to COVID-19. Bebe provided verbal consent for today's telepsychological appointment and she is aware she is responsible for securing confidentiality on her end of the session. Prior to proceeding with today's appointment, [name]'s physical location at the time of this appointment was obtained as well a phone number she could be reached at in the event of technical difficulties. Wanisha and this provider participated in today's telepsychological service.   This provider conducted a brief check-in. *** [name] was receptive to today's appointment as evidenced by openness to sharing, responsiveness to feedback, and {gbreceptiveness:23401}.  Mental Status Examination:  Appearance: {Appearance:22431} Behavior: {Behavior:22445} Mood: {gbmood:21757} Affect: {Affect:22436} Speech: {Speech:22432} Eye Contact: {Eye Contact:22433} Psychomotor Activity: {Motor Activity:22434} Gait: {gbgait:23404} Thought Process: {thought process:22448}  Thought Content/Perception: {disturbances:22451} Orientation: {Orientation:22437} Memory/Concentration: {gbcognition:22449} Insight/Judgment: {Insight:22446}  Interventions:  {Interventions for Progress Notes:23405}  DSM-5 Diagnosis(es): F50.89 Other Specified Feeding or Eating Disorder, Emotional Eating Behaviors  Treatment Goal & Progress: During the initial appointment with this provider,  the following treatment goal was established: increase coping skills. Ginny has demonstrated progress in her goal as evidenced by {gbtxprogress:22839}. Renita also {gbtxprogress2:22951}.  Plan: The next appointment will be scheduled in {gbweeks:21758}, which will be {gbtxmodality:23402}. The next session will focus on {Plan for Next Appointment:23400}.

## 2020-11-26 ENCOUNTER — Telehealth (INDEPENDENT_AMBULATORY_CARE_PROVIDER_SITE_OTHER): Payer: No Typology Code available for payment source | Admitting: Adult Health

## 2020-11-27 ENCOUNTER — Ambulatory Visit (INDEPENDENT_AMBULATORY_CARE_PROVIDER_SITE_OTHER): Payer: No Typology Code available for payment source | Admitting: Adult Health

## 2020-12-02 ENCOUNTER — Telehealth (INDEPENDENT_AMBULATORY_CARE_PROVIDER_SITE_OTHER): Payer: No Typology Code available for payment source | Admitting: Psychology

## 2020-12-05 ENCOUNTER — Encounter (INDEPENDENT_AMBULATORY_CARE_PROVIDER_SITE_OTHER): Payer: Self-pay | Admitting: Adult Health

## 2020-12-05 ENCOUNTER — Other Ambulatory Visit: Payer: Self-pay

## 2020-12-05 ENCOUNTER — Ambulatory Visit (INDEPENDENT_AMBULATORY_CARE_PROVIDER_SITE_OTHER): Payer: No Typology Code available for payment source | Admitting: Adult Health

## 2020-12-05 VITALS — BP 111/74 | HR 75 | Temp 97.9°F | Ht 64.0 in | Wt 228.0 lb

## 2020-12-05 DIAGNOSIS — K5900 Constipation, unspecified: Secondary | ICD-10-CM

## 2020-12-05 DIAGNOSIS — E559 Vitamin D deficiency, unspecified: Secondary | ICD-10-CM | POA: Diagnosis not present

## 2020-12-05 DIAGNOSIS — Z6841 Body Mass Index (BMI) 40.0 and over, adult: Secondary | ICD-10-CM | POA: Diagnosis not present

## 2020-12-05 NOTE — Progress Notes (Signed)
     Chief Complaint:   OBESITY Bethany Craig is here to discuss her progress with her obesity treatment plan along with follow-up of her obesity related diagnoses. [name] is on the Vegetarian Plan and states she is following her eating plan approximately 100% of the time. [name] states she is not exercising regularly.  Today's visit was #: 5 Starting weight: 246 lbs Starting date: 09/16/2020 Today's weight: 228 lbs Today's date: 12/05/2020 Total lbs lost to date: 8 lbs Total lbs lost since last in-office visit: 18 lbs  Interim History: [name] tested positive for COVID-19 2 weeks ago - lingering symptoms include productive cough (clear phlegm) or nasal drainage.  She has had 2 doses of the Pfizer MRNA vaccine - last dose September 2021.  She was planning on receiving Pfizer booster just prior to recent COVID-19 infection.  Subjective:   1. Constipation, unspecified constipation type Per patient, resolved with MiraLAX. She denies abdominal pain, hematochezia.  Recent C-scope with 1 small polyp (benign) - repeat study 10 years.  2. Vitamin D deficiency She is on OTC vitamin D3 5,000 IU daily.  Assessment/Plan:   1. Constipation, unspecified constipation type Remain well hydrated and OTC MiraLAX as needed.  2. Vitamin D deficiency Continue OTC supplement.  3. Obesity with current BMI 39.1  [name] is currently in the action stage of change. As such, her goal is to continue with weight loss efforts. She has agreed to the Vegetarian Plan.   Exercise goals:  Increase walking.  Behavioral modification strategies: increasing lean protein intake, decreasing simple carbohydrates, meal planning and cooking strategies, keeping healthy foods in the home, and planning for success.  Obtain new COVID-19 booster when all symptoms have resolved +1 week.  [name] has agreed to follow-up with our clinic in 2 weeks. She was informed of the importance of frequent follow-up visits to maximize her  success with intensive lifestyle modifications for her multiple health conditions.   Objective:   Blood pressure 111/74, pulse 75, temperature 97.9 F (36.6 C), height 5\' 4"  (1.626 m), weight 228 lb (103.4 kg), SpO2 97 %. Body mass index is 39.14 kg/m.  General: Cooperative, alert, well developed, in no acute distress. HEENT: Conjunctivae and lids unremarkable. Cardiovascular: Regular rhythm.  Lungs: Normal work of breathing. Neurologic: No focal deficits.   Lab Results  Component Value Date   CREATININE 0.63 12/07/2013   BUN 14 12/07/2013   NA 139 12/07/2013   K 3.8 12/07/2013   CL 103 12/07/2013   CO2 25 12/07/2013   Lab Results  Component Value Date   INSULIN 13.7 09/16/2020   Lab Results  Component Value Date   WBC 6.3 12/07/2013   HGB 12.8 12/07/2013   HCT 36.9 12/07/2013   MCV 88.5 12/07/2013   PLT 357 12/07/2013   Attestation Statements:   Reviewed by clinician on day of visit: allergies, medications, problem list, medical history, surgical history, family history, social history, and previous encounter notes.  Time spent on visit including pre-visit chart review and post-visit care and charting was 28 minutes.   I, 12/09/2013, CMA, am acting as Insurance claims handler for Energy manager, NP.  I have reviewed the above documentation for accuracy and completeness, and I agree with the above. -  Mercie Balsley d. Makeshia Seat, NP-C

## 2020-12-22 ENCOUNTER — Ambulatory Visit (INDEPENDENT_AMBULATORY_CARE_PROVIDER_SITE_OTHER): Payer: No Typology Code available for payment source | Admitting: Physician Assistant

## 2021-01-08 ENCOUNTER — Other Ambulatory Visit: Payer: Self-pay

## 2021-01-08 ENCOUNTER — Encounter (INDEPENDENT_AMBULATORY_CARE_PROVIDER_SITE_OTHER): Payer: Self-pay | Admitting: Family Medicine

## 2021-01-08 ENCOUNTER — Ambulatory Visit (INDEPENDENT_AMBULATORY_CARE_PROVIDER_SITE_OTHER): Payer: No Typology Code available for payment source | Admitting: Family Medicine

## 2021-01-08 VITALS — BP 121/85 | HR 79 | Temp 98.0°F | Ht 64.0 in | Wt 228.0 lb

## 2021-01-08 DIAGNOSIS — R7989 Other specified abnormal findings of blood chemistry: Secondary | ICD-10-CM | POA: Diagnosis not present

## 2021-01-08 DIAGNOSIS — Z9189 Other specified personal risk factors, not elsewhere classified: Secondary | ICD-10-CM

## 2021-01-08 DIAGNOSIS — E559 Vitamin D deficiency, unspecified: Secondary | ICD-10-CM | POA: Diagnosis not present

## 2021-01-08 DIAGNOSIS — R7303 Prediabetes: Secondary | ICD-10-CM

## 2021-01-08 DIAGNOSIS — K5900 Constipation, unspecified: Secondary | ICD-10-CM

## 2021-01-08 DIAGNOSIS — Z6841 Body Mass Index (BMI) 40.0 and over, adult: Secondary | ICD-10-CM

## 2021-01-08 DIAGNOSIS — E039 Hypothyroidism, unspecified: Secondary | ICD-10-CM | POA: Insufficient documentation

## 2021-01-08 MED ORDER — POLYETHYLENE GLYCOL 3350 17 GM/SCOOP PO POWD: ORAL | 0 refills | Status: AC

## 2021-01-08 NOTE — Progress Notes (Signed)
Chief Complaint:   OBESITY Bethany Craig is here to discuss her progress with her obesity treatment plan along with follow-up of her obesity related diagnoses. Bethany Craig is on the Vegetarian Plan and states she is following her eating plan approximately 70% of the time. Bethany Craig states she is not currently exercising.  Today's visit was #: 6 Starting weight: 246 lbs Starting date: 09/16/2020 Today's weight: 228 lbs Today's date: 01/08/2021 Total lbs lost to date: 8 Total lbs lost since last in-office visit: 0  Interim History: Bethany Craig has been working a lot lately. She is not eating healthy all the time, then she won't eat much to try to compensate. She has been less active and skips some meals due to business at work. She is also not drinking much water.  Subjective:   1. Constipation, unspecified constipation type Bethany Craig has had an increase in symptoms lately due to poor PO intake.  2. Pre-diabetes Bethany Craig has a diagnosis of prediabetes based on her elevated HgA1c and was informed this puts her at greater risk of developing diabetes. She continues to work on diet and exercise to decrease her risk of diabetes. She denies nausea or hypoglycemia. Medication: None  No results found for: HGBA1C Lab Results  Component Value Date   INSULIN 13.7 09/16/2020   3. Vitamin D deficiency She is currently taking OTC vitamin D 5,000 IU each day. She denies nausea, vomiting or muscle weakness.  4. Elevated TSH  Bethany Craig has a history of elevated TSH in the past. Level was 16.8 on 07/2019. She has not had meds in a year or more. Recheck labs in the near future.  5. At risk for diabetes mellitus Bethany Craig is at higher than average risk for developing diabetes due to obesity and pre-diabetes.   Assessment/Plan:   Orders Placed This Encounter  Procedures   VITAMIN D 25 Hydroxy (Vit-D Deficiency, Fractures)   T4, free   TSH   Hemoglobin A1c   Insulin, random    Medications Discontinued During  This Encounter  Medication Reason   polyethylene glycol powder (GLYCOLAX/MIRALAX) 17 GM/SCOOP powder Reorder     Meds ordered this encounter  Medications   polyethylene glycol powder (GLYCOLAX/MIRALAX) 17 GM/SCOOP powder    Sig: May use once or twice daily until normal stools, then prn constipation    Dispense:  116 g    Refill:  0    Prescribe smallest bottle please - 116 gr appears to be that     1. Constipation, unspecified constipation type Bethany Craig was informed that a decrease in bowel movement frequency is normal while losing weight, but stools should not be hard or painful. Orders and follow up as documented in patient record. Increase water intake.  Counseling Getting to Good Bowel Health: Your goal is to have one soft bowel movement each day. Drink at least 8 glasses of water each day. Eat plenty of fiber (goal is over 25 grams each day). It is best to get most of your fiber from dietary sources which includes leafy green vegetables, fresh fruit, and whole grains. You may need to add fiber with the help of OTC fiber supplements. These include Metamucil, Citrucel, and Flaxseed. If you are still having trouble, try adding Miralax or Magnesium Citrate. If all of these changes do not work, Dietitian.  Refill- polyethylene glycol powder (GLYCOLAX/MIRALAX) 17 GM/SCOOP powder; May use once or twice daily until normal stools, then prn constipation  Dispense: 116 g; Refill: 0  2. Pre-diabetes Bethany Craig  will continue prudent nutritional plan, weight loss, exercise, and decreasing simple carbohydrates to help decrease the risk of diabetes. Check fasting labs at next OV.  - Hemoglobin A1c - Insulin, random  3. Vitamin D deficiency Low Vitamin D level contributes to fatigue and are associated with obesity, breast, and colon cancer. She agrees to continue OTC Vitamin D 5,000 IU QD and will follow-up for routine testing of Vitamin D, at least 2-3 times per year to avoid  over-replacement. Check fasting labs at next OV.  - VITAMIN D 25 Hydroxy (Vit-D Deficiency, Fractures)  4. Elevated TSH  Check fasting labs at next OV.  - T4, free - TSH  5. At risk for diabetes mellitus Bethany Craig was given approximately 9 minutes of diabetes education and counseling today. We discussed intensive lifestyle modifications today with an emphasis on weight loss as well as increasing exercise and decreasing simple carbohydrates in her diet. We also reviewed medication options with an emphasis on risk versus benefit of those discussed.   Repetitive spaced learning was employed today to elicit superior memory formation and behavioral change.  6. Obesity with current BMI of 39.2  Renie is currently in the action stage of change. As such, her goal is to continue with weight loss efforts. She has agreed to the Vegetarian Plan.   Goals: Needs to exercise 3 days a week- pt will get up early and go to the gym/walking 30 minutes Drink more water- goal is 1 gallon a day  Exercise goals:  Do 30 minutes 3 days a week of exercise.  Behavioral modification strategies: increasing lean protein intake, decreasing simple carbohydrates, meal planning and cooking strategies, and planning for success.  Bethany Craig has agreed to follow-up with our clinic in 3 weeks with NP [facility] or Dr. Sharee Holster- pt will obtain labs prior to next OV or at next OV. She was informed of the importance of frequent follow-up visits to maximize her success with intensive lifestyle modifications for her multiple health conditions.   Objective:   Blood pressure 121/85, pulse 79, temperature 98 F (36.7 C), height 5\' 4"  (1.626 m), weight 228 lb (103.4 kg), SpO2 97 %. Body mass index is 39.14 kg/m.  General: Cooperative, alert, well developed, in no acute distress. HEENT: Conjunctivae and lids unremarkable. Cardiovascular: Regular rhythm.  Lungs: Normal work of breathing. Neurologic: No focal deficits.   Lab Results   Component Value Date   CREATININE 0.63 12/07/2013   BUN 14 12/07/2013   NA 139 12/07/2013   K 3.8 12/07/2013   CL 103 12/07/2013   CO2 25 12/07/2013   No results found for: ALT, AST, GGT, ALKPHOS, BILITOT No results found for: HGBA1C Lab Results  Component Value Date   INSULIN 13.7 09/16/2020   No results found for: TSH No results found for: CHOL, HDL, LDLCALC, LDLDIRECT, TRIG, CHOLHDL No results found for: 09/18/2020 Lab Results  Component Value Date   WBC 6.3 12/07/2013   HGB 12.8 12/07/2013   HCT 36.9 12/07/2013   MCV 88.5 12/07/2013   PLT 357 12/07/2013   Attestation Statements:   Reviewed by clinician on day of visit: allergies, medications, problem list, medical history, surgical history, family history, social history, and previous encounter notes.  12/09/2013, CMA, am acting as transcriptionist for Edmund Hilda, DO.  I have reviewed the above documentation for accuracy and completeness, and I agree with the above. [employer], D.O.  The 21st Century Cures Act was signed into law in 2016 which  includes the topic of electronic health records.  This provides immediate access to information in MyChart.  This includes consultation notes, operative notes, office notes, lab results and pathology reports.  If you have any questions about what you read please let us know at your next visit so we can discuss your concerns and take corrective action if need be.  We are right here with you.

## 2021-01-17 ENCOUNTER — Encounter (INDEPENDENT_AMBULATORY_CARE_PROVIDER_SITE_OTHER): Payer: Self-pay | Admitting: Family Medicine

## 2021-01-26 ENCOUNTER — Encounter (INDEPENDENT_AMBULATORY_CARE_PROVIDER_SITE_OTHER): Payer: Self-pay | Admitting: Family Medicine

## 2021-01-28 ENCOUNTER — Ambulatory Visit (INDEPENDENT_AMBULATORY_CARE_PROVIDER_SITE_OTHER): Payer: No Typology Code available for payment source | Admitting: Family Medicine

## 2021-01-28 ENCOUNTER — Other Ambulatory Visit: Payer: Self-pay

## 2021-01-28 ENCOUNTER — Encounter (INDEPENDENT_AMBULATORY_CARE_PROVIDER_SITE_OTHER): Payer: Self-pay | Admitting: Family Medicine

## 2021-01-28 VITALS — BP 122/78 | HR 95 | Temp 98.2°F | Ht 64.0 in | Wt 225.0 lb

## 2021-01-28 DIAGNOSIS — Z6841 Body Mass Index (BMI) 40.0 and over, adult: Secondary | ICD-10-CM

## 2021-01-28 DIAGNOSIS — Z9189 Other specified personal risk factors, not elsewhere classified: Secondary | ICD-10-CM

## 2021-01-28 DIAGNOSIS — R7303 Prediabetes: Secondary | ICD-10-CM

## 2021-01-28 DIAGNOSIS — D649 Anemia, unspecified: Secondary | ICD-10-CM

## 2021-01-28 DIAGNOSIS — E559 Vitamin D deficiency, unspecified: Secondary | ICD-10-CM | POA: Diagnosis not present

## 2021-01-28 DIAGNOSIS — R7989 Other specified abnormal findings of blood chemistry: Secondary | ICD-10-CM

## 2021-01-29 LAB — CBC WITH DIFFERENTIAL/PLATELET
Basophils Absolute: 0 10*3/uL (ref 0.0–0.2)
Basos: 0 %
EOS (ABSOLUTE): 0.1 10*3/uL (ref 0.0–0.4)
Eos: 1 %
Hematocrit: 37.6 % (ref 34.0–46.6)
Hemoglobin: 12.5 g/dL (ref 11.1–15.9)
Immature Grans (Abs): 0 10*3/uL (ref 0.0–0.1)
Immature Granulocytes: 0 %
Lymphocytes Absolute: 2.1 10*3/uL (ref 0.7–3.1)
Lymphs: 28 %
MCH: 28.9 pg (ref 26.6–33.0)
MCHC: 33.2 g/dL (ref 31.5–35.7)
MCV: 87 fL (ref 79–97)
Monocytes Absolute: 0.4 10*3/uL (ref 0.1–0.9)
Monocytes: 5 %
Neutrophils Absolute: 5 10*3/uL (ref 1.4–7.0)
Neutrophils: 66 %
Platelets: 394 10*3/uL (ref 150–450)
RBC: 4.33 x10E6/uL (ref 3.77–5.28)
RDW: 12.7 % (ref 11.7–15.4)
WBC: 7.5 10*3/uL (ref 3.4–10.8)

## 2021-01-29 LAB — TSH: TSH: 3.05 u[IU]/mL (ref 0.450–4.500)

## 2021-01-29 LAB — FERRITIN: Ferritin: 94 ng/mL (ref 15–150)

## 2021-01-29 LAB — TRANSFERRIN: Transferrin: 268 mg/dL (ref 192–364)

## 2021-01-29 LAB — T4, FREE: Free T4: 1.44 ng/dL (ref 0.82–1.77)

## 2021-01-29 LAB — IRON AND TIBC
Iron Saturation: 30 % (ref 15–55)
Iron: 93 ug/dL (ref 27–159)
Total Iron Binding Capacity: 306 ug/dL (ref 250–450)
UIBC: 213 ug/dL (ref 131–425)

## 2021-01-29 LAB — VITAMIN D 25 HYDROXY (VIT D DEFICIENCY, FRACTURES): Vit D, 25-Hydroxy: 47.8 ng/mL (ref 30.0–100.0)

## 2021-01-29 LAB — HEMOGLOBIN A1C
Est. average glucose Bld gHb Est-mCnc: 114 mg/dL
Hgb A1c MFr Bld: 5.6 % (ref 4.8–5.6)

## 2021-01-29 LAB — INSULIN, RANDOM: INSULIN: 6.8 u[IU]/mL (ref 2.6–24.9)

## 2021-01-29 NOTE — Progress Notes (Signed)
Chief Complaint:   OBESITY Tailer is here to discuss her progress with her obesity treatment plan along with follow-up of her obesity related diagnoses. Hasna is on the Vegetarian Plan and states she is following her eating plan approximately 90% of the time. Sharlie states she is walking and jogging for 30-45 minutes 3-4 times per week.  Today's visit was #: 7 Starting weight: 246 lbs Starting date: 09/16/2020 Today's weight: 225 lbs Today's date: 01/28/2021 Total lbs lost to date: 21 Total lbs lost since last in-office visit: 3  Interim History: LASHANTA ELBE is here for a follow up office visit. We reviewed her meal plan and questions were answered. Patient's food recall appears to be accurate and consistent with what is on plan when she is following it. When eating on plan, her hunger and cravings are well controlled. Makesha is doing great! She has increased her exercise. She was told that her FE level is low at her recent plasma donation appointment. She was also told that her hematocrit was 35.  Subjective:   1. Pre-diabetes Kiandra has a diagnosis of pre-diabetes based on her elevated HgA1c and was informed this puts her at greater risk of developing diabetes. She is not on medications, and she denies cravings or hunger. She continues to work on diet and exercise to decrease her risk of diabetes. She denies nausea or hypoglycemia.  2. 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 She has not taken FE supplementation for many years now. She has been stable, and without symptoms or concerns.  3. Abnormal TSH Lynzy has a history of elevated TSH at 16.8 in May 2021. Her recent TSH 4 months ago was 4.14. She has no more fatigue than usual and no acute concerns.  4. Vitamin D deficiency Lakhia's Vit D level was only 29.0 approximately 4-5 months ago. She has been on OTC Vitamin D 5,000 units ever  since. She denies nausea, vomiting or muscle weakness.  5. At risk for impaired metabolic function Sharonna is at increased risk for impaired metabolic function due to pre-diabetes and elevated TSH.  Assessment/Plan:   Orders Placed This Encounter  Procedures   CBC with Differential/Platelet   Iron and TIBC   Ferritin   Transferrin   VITAMIN D 25 Hydroxy (Vit-D Deficiency, Fractures)   Hemoglobin A1c   Insulin, random    There are no discontinued medications.   No orders of the defined types were placed in this encounter.    1. Pre-diabetes Mercede will continue to work on weight loss, exercise, and decreasing simple carbohydrates to help decrease the risk of diabetes. We will check labs today.  - Hemoglobin A1c - Insulin, random  2. Low hematocrit with history of iron deficiency anemia We will check labs today, and will follow up at Nikira's next office visit.   - CBC with Differential/Platelet - Iron and TIBC - Ferritin - Transferrin  3. Abnormal TSH We will check labs today, and will follow up at Saraann's next office visit. Orders and follow up as documented in patient record.  Counseling Good thyroid control is important for overall health. Supratherapeutic thyroid levels are dangerous and will not improve weight loss results. Counseling: The correct way to take levothyroxine is fasting, with water, separated by at least 30 minutes from breakfast, and separated by more than 4 hours from calcium, iron, multivitamins, acid reflux medications (PPIs).   4. Vitamin D deficiency  Low Vitamin D level contributes to fatigue and are associated with obesity, breast, and colon cancer. We will check labs today, and Lanet will continue her OTC Vitamin D supplementation. She will follow-up for routine testing of Vitamin D, at least 2-3 times per year to avoid over-replacement.  - VITAMIN D 25 Hydroxy (Vit-D Deficiency, Fractures)  5. At risk for impaired metabolic  function Pamala was given approximately 9 minutes of impaired  metabolic function prevention counseling today. We discussed intensive lifestyle modifications today with an emphasis on specific nutrition and exercise instructions and strategies.   Repetitive spaced learning was employed today to elicit superior memory formation and behavioral change.  6. Obesity with current BMI of 38.6 Valentine is currently in the action stage of change. As such, her goal is to continue with weight loss efforts. She has agreed to the Vegetarian Plan.   Exercise goals: As is.  Behavioral modification strategies: holiday eating strategies  and planning for success.  Tira has agreed to follow-up with our clinic in 2 weeks. She was informed of the importance of frequent follow-up visits to maximize her success with intensive lifestyle modifications for her multiple health conditions.   Ismelda was informed we would discuss her lab results at her next visit unless there is a critical issue that needs to be addressed sooner. Anaisabel agreed to keep her next visit at the agreed upon time to discuss these results.  Objective:   Blood pressure 122/78, pulse 95, temperature 98.2 F (36.8 C), height 5\' 4"  (1.626 m), weight 225 lb (102.1 kg), SpO2 98 %. Body mass index is 38.62 kg/m.  General: Cooperative, alert, well developed, in no acute distress. HEENT: Conjunctivae and lids unremarkable. Cardiovascular: Regular rhythm.  Lungs: Normal work of breathing. Neurologic: No focal deficits.   Lab Results  Component Value Date   CREATININE 0.63 12/07/2013   BUN 14 12/07/2013   NA 139 12/07/2013   K 3.8 12/07/2013   CL 103 12/07/2013   CO2 25 12/07/2013   No results found for: ALT, AST, GGT, ALKPHOS, BILITOT Lab Results  Component Value Date   HGBA1C 5.6 01/28/2021   Lab Results  Component Value Date   INSULIN 6.8 01/28/2021   INSULIN 13.7 09/16/2020   Lab Results  Component Value Date   TSH 3.050  01/28/2021   No results found for: CHOL, HDL, LDLCALC, LDLDIRECT, TRIG, CHOLHDL Lab Results  Component Value Date   VD25OH 47.8 01/28/2021   Lab Results  Component Value Date   WBC 7.5 01/28/2021   HGB 12.5 01/28/2021   HCT 37.6 01/28/2021   MCV 87 01/28/2021   PLT 394 01/28/2021   Lab Results  Component Value Date   IRON 93 01/28/2021   TIBC 306 01/28/2021   FERRITIN 94 01/28/2021   Attestation Statements:   Reviewed by clinician on day of visit: allergies, medications, problem list, medical history, surgical history, family history, social history, and previous encounter notes.   Trude Mcburney, am acting as transcriptionist for [employer], DO.  I have reviewed the above documentation for accuracy and completeness, and I agree with the above. Carlye Grippe, D.O.  The 21st Century Cures Act was signed into law in 2016 which includes the topic of electronic health records.  This provides immediate access to information in MyChart.  This includes consultation notes, operative notes, office notes, lab results and pathology reports.  If you have any questions about what you read please let us know at your  next visit so we can discuss your concerns and take corrective action if need be.  We are right here with you.

## 2021-02-16 ENCOUNTER — Ambulatory Visit (INDEPENDENT_AMBULATORY_CARE_PROVIDER_SITE_OTHER): Payer: No Typology Code available for payment source | Admitting: Family Medicine

## 2021-03-04 ENCOUNTER — Other Ambulatory Visit: Payer: Self-pay

## 2021-03-04 ENCOUNTER — Telehealth (INDEPENDENT_AMBULATORY_CARE_PROVIDER_SITE_OTHER): Payer: No Typology Code available for payment source | Admitting: Family Medicine

## 2021-03-04 ENCOUNTER — Encounter (INDEPENDENT_AMBULATORY_CARE_PROVIDER_SITE_OTHER): Payer: Self-pay | Admitting: Family Medicine

## 2021-03-04 DIAGNOSIS — R7303 Prediabetes: Secondary | ICD-10-CM

## 2021-03-04 DIAGNOSIS — Z6841 Body Mass Index (BMI) 40.0 and over, adult: Secondary | ICD-10-CM

## 2021-03-04 MED ORDER — METFORMIN HCL 500 MG PO TABS: ORAL_TABLET | ORAL | 0 refills | Status: AC

## 2021-03-05 NOTE — Progress Notes (Signed)
TeleHealth Visit:  Due to the COVID-19 pandemic, this visit was completed with telemedicine (audio/video) technology to reduce patient and provider exposure as well as to preserve personal protective equipment.   Bethany Craig has verbally consented to this TeleHealth visit. The patient is located at home, the provider is located at the [facility] office. The participants in this visit include the listed provider and patient. The visit was conducted today via video.   Chief Complaint: OBESITY Bethany Craig is here to discuss her progress with her obesity treatment plan along with follow-up of her obesity related diagnoses. Bethany Craig is on the Vegetarian Plan and states she is following her eating plan approximately 100% of the time. Bethany Craig states she is not currently exercising.  Today's visit was #: 8 Starting weight: 246 lbs Starting date: 09/16/2020  Interim History: Pt's daughter has a cold, and pt woke up this morning with head congestion, stuffy nose, and headache. She denies sore throat, SOB, or cough. She eats OK, 1-2 meals per day, but then goes off plan for the rest of the day.  Subjective:   1. Prediabetes Pt has increased carb cravings for cakes/sweets.  Assessment/Plan:  No orders of the defined types were placed in this encounter.   There are no discontinued medications.   Meds ordered this encounter  Medications   metFORMIN (GLUCOPHAGE) 500 MG tablet    Sig: 1 po with lunch daily    Dispense:  30 tablet    Refill:  0    30 d supply;  ** OV for RF **   Do not send RF request     1. Prediabetes Bethany Craig will continue to work on weight loss, exercise, and decreasing simple carbohydrates to help decrease the risk of diabetes.  Start Metformin 500 mg QD. Risks and benefits discussed with pt. Precaution with sweets advised.  Start- metFORMIN (GLUCOPHAGE) 500 MG tablet; 1 po with lunch daily  Dispense: 30 tablet; Refill: 0  2. Obesity with current BMI of  38.62  Bethany Craig is currently in the action stage of change. As such, her goal is to continue with weight loss efforts. She has agreed to the Vegetarian Plan.   Exercise goals: All adults should avoid inactivity. Some physical activity is better than none, and adults who participate in any amount of physical activity gain some health benefits.  Behavioral modification strategies: increasing lean protein intake, decreasing simple carbohydrates, and holiday eating strategies .  Bethany Craig has agreed to follow-up with our clinic in 4 weeks. She was informed of the importance of frequent follow-up visits to maximize her success with intensive lifestyle modifications for her multiple health conditions.  Objective:   VITALS: Per patient if applicable, see vitals. GENERAL: Alert and in no acute distress. CARDIOPULMONARY: No increased WOB. Speaking in clear sentences.  PSYCH: Pleasant and cooperative. Speech normal rate and rhythm. Affect is appropriate. Insight and judgement are appropriate. Attention is focused, linear, and appropriate.  NEURO: Oriented as arrived to appointment on time with no prompting.   Lab Results  Component Value Date   CREATININE 0.63 12/07/2013   BUN 14 12/07/2013   NA 139 12/07/2013   K 3.8 12/07/2013   CL 103 12/07/2013   CO2 25 12/07/2013   No results found for: ALT, AST, GGT, ALKPHOS, BILITOT Lab Results  Component Value Date   HGBA1C 5.6 01/28/2021   Lab Results  Component Value Date   INSULIN 6.8 01/28/2021   INSULIN 13.7 09/16/2020   Lab Results  Component Value Date   TSH 3.050 01/28/2021   No results found for: CHOL, HDL, LDLCALC, LDLDIRECT, TRIG, CHOLHDL Lab Results  Component Value Date   VD25OH 47.8 01/28/2021   Lab Results  Component Value Date   WBC 7.5 01/28/2021   HGB 12.5 01/28/2021   HCT 37.6 01/28/2021   MCV 87 01/28/2021   PLT 394 01/28/2021   Lab Results  Component Value Date   IRON 93 01/28/2021   TIBC 306 01/28/2021    FERRITIN 94 01/28/2021    Attestation Statements:   Reviewed by clinician on day of visit: allergies, medications, problem list, medical history, surgical history, family history, social history, and previous encounter notes.  Edmund Hilda, CMA, am acting as transcriptionist for [employer], DO.  I have reviewed the above documentation for accuracy and completeness, and I agree with the above. Carlye Grippe, D.O.  The 21st Century Cures Act was signed into law in 2016 which includes the topic of electronic health records.  This provides immediate access to information in MyChart.  This includes consultation notes, operative notes, office notes, lab results and pathology reports.  If you have any questions about what you read please let us know at your next visit so we can discuss your concerns and take corrective action if need be.  We are right here with you.

## 2021-04-02 ENCOUNTER — Ambulatory Visit (INDEPENDENT_AMBULATORY_CARE_PROVIDER_SITE_OTHER): Payer: Self-pay | Admitting: Family Medicine

## 2021-10-28 ENCOUNTER — Encounter (INDEPENDENT_AMBULATORY_CARE_PROVIDER_SITE_OTHER): Payer: Self-pay

## 2021-11-14 ENCOUNTER — Emergency Department (HOSPITAL_COMMUNITY)
Admission: EM | Admit: 2021-11-14 | Discharge: 2021-11-14 | Disposition: A | Discharge status: Home / Self Care | Payer: BC Managed Care – PPO | Attending: Emergency Medicine | Admitting: Emergency Medicine

## 2021-11-14 ENCOUNTER — Encounter (HOSPITAL_COMMUNITY): Payer: Self-pay

## 2021-11-14 ENCOUNTER — Emergency Department (HOSPITAL_COMMUNITY): Payer: BC Managed Care – PPO

## 2021-11-14 DIAGNOSIS — R110 Nausea: Secondary | ICD-10-CM | POA: Insufficient documentation

## 2021-11-14 DIAGNOSIS — R002 Palpitations: Secondary | ICD-10-CM | POA: Diagnosis not present

## 2021-11-14 DIAGNOSIS — R200 Anesthesia of skin: Secondary | ICD-10-CM | POA: Insufficient documentation

## 2021-11-14 DIAGNOSIS — R0789 Other chest pain: Secondary | ICD-10-CM | POA: Diagnosis present

## 2021-11-14 DIAGNOSIS — R42 Dizziness and giddiness: Secondary | ICD-10-CM | POA: Insufficient documentation

## 2021-11-14 DIAGNOSIS — R0602 Shortness of breath: Secondary | ICD-10-CM | POA: Diagnosis not present

## 2021-11-14 LAB — BASIC METABOLIC PANEL
Anion gap: 7 (ref 5–15)
BUN: 13 mg/dL (ref 6–20)
CO2: 26 mmol/L (ref 22–32)
Calcium: 9.4 mg/dL (ref 8.9–10.3)
Chloride: 109 mmol/L (ref 98–111)
Creatinine, Ser: 0.93 mg/dL (ref 0.44–1.00)
GFR, Estimated: >60 mL/min (ref >60)
Glucose, Bld: 110 mg/dL — ABNORMAL HIGH (ref 70–99)
Potassium: 3.8 mmol/L (ref 3.5–5.1)
Sodium: 142 mmol/L (ref 135–145)

## 2021-11-14 LAB — CBC
HCT: 41.2 % (ref 36.0–46.0)
Hemoglobin: 13.5 g/dL (ref 12.0–15.0)
MCH: 30.3 pg (ref 26.0–34.0)
MCHC: 32.8 g/dL (ref 30.0–36.0)
MCV: 92.6 fL (ref 80.0–100.0)
Platelets: 370 10*3/uL (ref 150–400)
RBC: 4.45 MIL/uL (ref 3.87–5.11)
RDW: 12.7 % (ref 11.5–15.5)
WBC: 6.7 10*3/uL (ref 4.0–10.5)
nRBC: 0 % (ref 0.0–0.2)

## 2021-11-14 LAB — TROPONIN I (HIGH SENSITIVITY)
Troponin I (High Sensitivity): <2 ng/L (ref <18)
Troponin I (High Sensitivity): <2 ng/L (ref <18)

## 2021-11-14 LAB — I-STAT BETA HCG BLOOD, ED (MC, WL, AP ONLY): I-stat hCG, quantitative: <5 m[IU]/mL (ref <5)

## 2021-11-14 LAB — D-DIMER, QUANTITATIVE: D-Dimer, Quant: <0.27 ug/mL-FEU (ref 0.00–0.50)

## 2021-11-14 MED ORDER — METHOCARBAMOL 500 MG PO TABS
500.0000 mg | ORAL_TABLET | Freq: Once | ORAL | Status: AC
Start: 2021-11-14 — End: 2021-11-14
  Administered 2021-11-14: 500 mg via ORAL
  Filled 2021-11-14: qty 1

## 2021-11-14 MED ORDER — KETOROLAC TROMETHAMINE 15 MG/ML IJ SOLN
15.0000 mg | Freq: Once | INTRAMUSCULAR | Status: AC
  Administered 2021-11-14: 15 mg via INTRAVENOUS
  Filled 2021-11-14: qty 1

## 2021-11-14 MED ORDER — IBUPROFEN 600 MG PO TABS: 600.0000 mg | ORAL_TABLET | Freq: Four times a day (QID) | ORAL | 0 refills | Status: AC | PRN

## 2021-11-14 MED ORDER — METHOCARBAMOL 500 MG PO TABS: 500.0000 mg | ORAL_TABLET | Freq: Two times a day (BID) | ORAL | 0 refills | Status: AC

## 2021-11-14 NOTE — ED Provider Notes (Signed)
[facility]-EMERGENCY DEPT Provider Note   CSN: [id number] Arrival date & time: 11/14/21  0746     History Chief Complaint  Patient presents with   Chest Pain    Bethany Craig is a 46 y.o. female otherwise healthy presents to the emergency room for evaluation of left-sided chest pain and tightness with left arm tingling intermittent since yesterday.  She reports its more of a tight feeling that it is a sharp feeling.  Denies any trauma to the area or any heavy lifting.  She reports some shortness of breath as taking deep breaths causes pain.  Orts some nausea without any vomiting.  Mild lightheadedness.  She reports that she thought she was having some palpitations yesterday as well but none today.  Denies any abdominal pain, fever, cough, or weakness.  No recent cough or cold symptoms.  Denies any weakness in the left arm.  No recent surgeries.  Denies any daily medications.  No known drug allergies.  Denies any tobacco, EtOH illicit drug use ever.  Pain is exacerbated upon deep inspiration or palpation of the left chest.   Chest Pain Associated symptoms: nausea, palpitations and shortness of breath   Associated symptoms: no abdominal pain, no back pain, no cough, no diaphoresis, no fever, no vomiting and no weakness        Home Medications Prior to Admission medications   Medication Sig Start Date End Date Taking? Authorizing Provider  Ascorbic Acid (VITAMIN C DROPS MT) Use as directed 90 mg in the mouth or throat.    [provider]  Cholecalciferol 125 MCG (5000 UT) capsule Take by mouth.    [provider]  Cyanocobalamin (VITAMIN B12 PO) Take 5,000 mcg by mouth.    [provider]  Cyanocobalamin 5000 MCG SUBL Place 120 mg under the tongue.    [provider]  MAGNESIUM PO Take by mouth.    [provider]  metFORMIN (GLUCOPHAGE) 500 MG tablet 1 po with lunch daily 03/04/21   Opalski, Gavin Pound, DO  Misc  Natural Products (JOINT SUPPORT PO) Take by mouth.    [provider]  Multiple Vitamins-Minerals (ZINC PO) Take by mouth.    [provider]  Nutritional Supplements (NUTRITIONAL SUPPLEMENT PO) Take by mouth. Collagen Peptides    [provider]  polyethylene glycol powder (GLYCOLAX/MIRALAX) 17 GM/SCOOP powder May use once or twice daily until normal stools, then prn constipation 01/08/21   Opalski, Deborah, DO  POTASSIUM PO Take 90 mg by mouth.    [provider]  Probiotic Product (PROBIOTIC PO) Take by mouth.    [provider]  TURMERIC CURCUMIN PO Take by mouth.    [provider]  vitamin k 100 MCG tablet Take 100 mcg by mouth daily.    [provider]      Allergies    Codeine    Review of Systems   Review of Systems  Constitutional:  Negative for chills, diaphoresis and fever.  Respiratory:  Positive for chest tightness and shortness of breath. Negative for cough.   Cardiovascular:  Positive for chest pain and palpitations.  Gastrointestinal:  Positive for nausea. Negative for abdominal pain, constipation, diarrhea and vomiting.  Genitourinary:  Negative for dysuria and hematuria.  Musculoskeletal:  Negative for back pain.  Neurological:  Positive for light-headedness. Negative for weakness.    Physical Exam Updated Vital Signs BP 124/78   Pulse 60   Temp 98 F (36.7 C) (Oral)   Resp  12   Ht 5\' 4"  (1.626 m)   Wt 102 kg   LMP 09/28/2020   SpO2 100%   BMI 38.60 kg/m  Physical Exam Vitals and nursing note reviewed.  Constitutional:      General: She is not in acute distress.    Appearance: Normal appearance. She is not ill-appearing or toxic-appearing.  HENT:     Head: Normocephalic and atraumatic.  Eyes:     General: No scleral icterus. Cardiovascular:     Rate and Rhythm: Normal rate and regular rhythm.     Pulses:          Radial pulses are 2+ on the right side and 2+ on the left side.        Dorsalis pedis pulses are 2+ on the right side and 2+ on the left side.       Posterior tibial pulses are 2+ on the right side and 2+ on the left side.     Heart sounds: Normal heart sounds.  Pulmonary:     Effort: Pulmonary effort is normal. No respiratory distress.     Breath sounds: Normal breath sounds. No decreased breath sounds.     Comments: Clear to auscultation bilaterally.  No respiratory distress, accessory muscle use, nasal flaring, cyanosis, or tripoding noted.  Patient speaking in full sentence with ease and is satting well on room air with any increased work of breathing. Chest:     Chest wall: Tenderness present. No mass, crepitus or edema.       Comments: Tenderness to palpation to the overlying area. No overlying skin changes, swelling, erythema, warmth, fluctuance, or induration noted.  No breast mass palpated.  No lymphadenopathy to the area. Abdominal:     General: Abdomen is flat. Bowel sounds are normal.     Palpations: Abdomen is soft.     Tenderness: There is no abdominal tenderness. There is no guarding or rebound.  Musculoskeletal:        General: No deformity.     Cervical back: Normal range of motion.     Right lower leg: No tenderness. No edema.     Left lower leg: No tenderness. No edema.     Comments: Pulses intact throughout.  Sensation is intact in patient's upper and lower bilateral extremities.  Compartments are soft.  Coloration and temperature appear in feel symmetric.  Patient is able to move her arm however whenever she lifts up her left shoulder straight, she does have some pain to the area.  No midline or paraspinal cervical, thoracic, or lumbar tenderness palpation.  Strength intact and equal the patient's upper and lower bilateral extremities.  Skin:    General: Skin is warm and dry.  Neurological:     General: No focal deficit present.     Mental Status: She is alert. Mental status is at baseline.     ED Results / Procedures / Treatments    Labs (all labs ordered are listed, but only abnormal results are displayed) Labs Reviewed  BASIC METABOLIC PANEL - Abnormal; Notable for the following components:      Result Value   Glucose, Bld 110 (*)    All other components within normal limits  CBC  D-DIMER, QUANTITATIVE  I-STAT BETA HCG BLOOD, ED ([facility], [facility], [facility] ONLY)  TROPONIN I (HIGH SENSITIVITY)  TROPONIN I (HIGH SENSITIVITY)    EKG EKG Interpretation  Date/Time:  Saturday November 14 2021 07:52:35 EDT Ventricular Rate:  72 PR Interval:  164 QRS Duration: 89  QT Interval:  391 QTC Calculation: 428 R Axis:   98 Text Interpretation: Sinus rhythm Borderline right axis deviation Low voltage, precordial leads No significant change since last tracing Confirmed by Arturo Morton ([id number]) on 11/14/2021 12:46:19 PM  Radiology DG Chest 2 View  Result Date: 11/14/2021 CLINICAL DATA:  Chest pain EXAM: CHEST - 2 VIEW COMPARISON:  12/07/2013 FINDINGS: The cardiomediastinal silhouette is unremarkable. There is no evidence of focal airspace disease, pulmonary edema, suspicious pulmonary nodule/mass, pleural effusion, or pneumothorax. No acute bony abnormalities are identified. IMPRESSION: No active cardiopulmonary disease. Electronically Signed   By: Harmon Pier M.D.   On: 11/14/2021 08:49    Procedures Procedures   Medications Ordered in ED Medications  methocarbamol (ROBAXIN) tablet 500 mg (500 mg Oral Given 11/14/21 1251)  ketorolac (TORADOL) 15 MG/ML injection 15 mg (15 mg Intravenous Given 11/14/21 1327)    ED Course/ Medical Decision Making/ A&P                           Medical Decision Making Amount and/or Complexity of Data Reviewed Labs: ordered. Radiology: ordered.  Risk Prescription drug management.   46 year old female presents the emergency room for evaluation of left-sided chest pain intermittent since yesterday.  Differential diagnosis includes was not limited to ACS, arrhythmia, pneumonia, pneumothorax,  dissection, aneurysm, PE, musculoskeletal, costochondritis, anxiety.  Vital signs are unremarkable.  Physical exam pertinent for chest wall tenderness on the left.  Clear lung sounds.  Regular rate and rhythm.  Although this sounds like chest wall pain, will order chest pain lab work-up with EKG and imaging.  I independently reviewed and interpreted the patient's labs.  BMP shows mildly elevated glucose at 110 otherwise no electrolyte abnormality.  D-dimer undetectable.  Troponin undetectable x2.  CBC without cytosis or anemia.  hCG negative.  EKG unchanged from previous as read interpreted by my attending.  Chest x-ray shows no acute cardiopulmonary process.  Given reassuring EKG, imaging, and negative troponins and D-dimers, I doubt any ACS, arrhythmia, or PE.  Story is not consistent with any dissection or aneurysm.  Pneumonia or pneumothorax not seen on chest x-ray.  This is likely musculoskeletal chest wall pain.  We will order patient Toradol and Robaxin for pain.  P.o. challenge initiated and passed.  On reevaluation, patient reports that she is feeling better after the muscle laxer and her chest wall pain is easing.  Although she has a benign cardiac work-up, given her age, we will send an ambulatory referral to cardiology for her to follow-up with.  Sent her in a prescription for ibuprofen and muscle laxer's.  We discussed return precautions and red flag symptoms.  Patient verbalized understanding and agrees to the plan.  Patient is stable and being discharged home in good condition.  Final Clinical Impression(s) / ED Diagnoses Final diagnoses:  Chest wall pain    Rx / DC Orders ED Discharge Orders          Ordered    methocarbamol (ROBAXIN) 500 MG tablet  2 times daily        11/14/21 1316    ibuprofen (ADVIL) 600 MG tablet  Every 6 hours PRN        11/14/21 1316    Ambulatory referral to Cardiology       Comments: If you have not heard from the Cardiology office within the  next 72 hours please call [phone].   11/14/21 1322  Achille Rich, PA-C 11/17/21 1458    Phoebe Sharps, DO 11/26/21 [phone]

## 2021-11-14 NOTE — ED Triage Notes (Signed)
Pt presents with c/o chest pain and dizziness that started yesterday. Pt reports yesterday that she felt like her heart was racing.

## 2021-11-14 NOTE — ED Provider Triage Note (Signed)
Emergency Medicine Provider Triage Evaluation Note  Bethany Craig , a 46 y.o. female  was evaluated in triage.  Pt complains of chest tightness since yesterday.  Got better but this morning got bad again.  Somewhat pleuritic in nature.  No ACS history.  Review of Systems  Positive: Chest tightness, difficulty taking a deep breath Negative: Shortness of breath, cough  Physical Exam  BP (!) 142/97 (BP Location: Left Arm)   Pulse 72   Temp 98.2 F (36.8 C) (Oral)   Resp 16   LMP 09/28/2020   SpO2 100%  Gen:   Awake, no distress   Resp:  Normal effort  MSK:   Moves extremities without difficulty  Other:  Lung sounds clear, RRR  Medical Decision Making  Medically screening exam initiated at 9:09 AM.  Appropriate orders placed.  Bethany Craig was informed that the remainder of the evaluation will be completed by another provider, this initial triage assessment does not replace that evaluation, and the importance of remaining in the ED until their evaluation is complete.   Pain is somewhat pleuritic however patient does not have any risk factors for PE.  Will defer D-dimer to more thorough evaluation of the back   Caeson Filippi A, PA-C 11/14/21 2111

## 2021-11-14 NOTE — Discharge Instructions (Addendum)
You were seen in the ER for evaluation of your chest pain. I likely think this is chest wall pain given that it is tender to the touch. Your lab work, imaging, and EKG were reassuring. I am still going to have you follow up with a cardiologist as needed. Please follow up with your PCP for re-evaluation in the upcoming week. I am giving you a muscle relaxer and ibuprofen for pain management. Please do not operated heavy machinery or drive while on the medication. I would like for you to take the ibuprofen every 6 hours for the next two days with food to help with inflammation. If you have worsening pain, shortness of breath, nausea, vomiting, fever, numbness, tingling, please return to the ER immediately.  **You can start taking ibuprofen 8 hours after your discharge from the ER.   Contact a doctor if: You have a fever. Your chest pain gets worse. You have new symptoms. Get help right away if: You feel sick to your stomach (nauseous) or you throw up (vomit). You feel sweaty or light-headed. You have a cough with mucus from your lungs (sputum) or you cough up blood. You are short of breath. These symptoms may be an emergency. Do not wait to see if the symptoms will go away. Get medical help right away. Call your local emergency services (911 in the [place] Do not drive yourself to the hospital.

## 2021-12-01 ENCOUNTER — Ambulatory Visit: Payer: Self-pay | Admitting: Internal Medicine

## 2023-07-11 ENCOUNTER — Encounter (HOSPITAL_COMMUNITY): Payer: Self-pay

## 2023-07-11 ENCOUNTER — Other Ambulatory Visit: Payer: Self-pay

## 2023-07-11 ENCOUNTER — Emergency Department (HOSPITAL_COMMUNITY)
Admission: EM | Admit: 2023-07-11 | Discharge: 2023-07-11 | Disposition: A | Discharge status: Home / Self Care | Attending: Emergency Medicine | Admitting: Emergency Medicine

## 2023-07-11 ENCOUNTER — Emergency Department (HOSPITAL_COMMUNITY)

## 2023-07-11 DIAGNOSIS — R0789 Other chest pain: Secondary | ICD-10-CM | POA: Insufficient documentation

## 2023-07-11 DIAGNOSIS — E876 Hypokalemia: Secondary | ICD-10-CM | POA: Diagnosis not present

## 2023-07-11 DIAGNOSIS — R519 Headache, unspecified: Secondary | ICD-10-CM | POA: Insufficient documentation

## 2023-07-11 DIAGNOSIS — R079 Chest pain, unspecified: Secondary | ICD-10-CM

## 2023-07-11 LAB — BASIC METABOLIC PANEL WITH GFR
Anion gap: 8 (ref 5–15)
BUN: 13 mg/dL (ref 6–20)
CO2: 22 mmol/L (ref 22–32)
Calcium: 8.7 mg/dL — ABNORMAL LOW (ref 8.9–10.3)
Chloride: 108 mmol/L (ref 98–111)
Creatinine, Ser: 0.44 mg/dL (ref 0.44–1.00)
GFR, Estimated: >60 mL/min (ref >60)
Glucose, Bld: 122 mg/dL — ABNORMAL HIGH (ref 70–99)
Potassium: 3.3 mmol/L — ABNORMAL LOW (ref 3.5–5.1)
Sodium: 138 mmol/L (ref 135–145)

## 2023-07-11 LAB — CBC
HCT: 38.1 % (ref 36.0–46.0)
Hemoglobin: 12.5 g/dL (ref 12.0–15.0)
MCH: 29.6 pg (ref 26.0–34.0)
MCHC: 32.8 g/dL (ref 30.0–36.0)
MCV: 90.3 fL (ref 80.0–100.0)
Platelets: 348 10*3/uL (ref 150–400)
RBC: 4.22 MIL/uL (ref 3.87–5.11)
RDW: 12 % (ref 11.5–15.5)
WBC: 9.2 10*3/uL (ref 4.0–10.5)
nRBC: 0 % (ref 0.0–0.2)

## 2023-07-11 LAB — TROPONIN I (HIGH SENSITIVITY)
Troponin I (High Sensitivity): 3 ng/L (ref <18)
Troponin I (High Sensitivity): 2 ng/L (ref <18)

## 2023-07-11 MED ORDER — POTASSIUM CHLORIDE CRYS ER 20 MEQ PO TBCR
40.0000 meq | EXTENDED_RELEASE_TABLET | Freq: Once | ORAL | Status: AC
  Administered 2023-07-11: 40 meq via ORAL
  Filled 2023-07-11: qty 2

## 2023-07-11 MED ORDER — SODIUM CHLORIDE 0.9 % IV BOLUS
1000.0000 mL | Freq: Once | INTRAVENOUS | Status: AC
  Administered 2023-07-11: 1000 mL via INTRAVENOUS

## 2023-07-11 MED ORDER — MORPHINE SULFATE (PF) 4 MG/ML IV SOLN
4.0000 mg | Freq: Once | INTRAVENOUS | Status: AC
  Administered 2023-07-11: 4 mg via INTRAVENOUS
  Filled 2023-07-11: qty 1

## 2023-07-11 MED ORDER — ONDANSETRON HCL 4 MG/2ML IJ SOLN
4.0000 mg | Freq: Once | INTRAMUSCULAR | Status: AC
  Administered 2023-07-11: 4 mg via INTRAVENOUS
  Filled 2023-07-11: qty 2

## 2023-07-11 MED ORDER — ASPIRIN 81 MG PO CHEW
324.0000 mg | CHEWABLE_TABLET | Freq: Once | ORAL | Status: AC
  Administered 2023-07-11: 324 mg via ORAL
  Filled 2023-07-11: qty 4

## 2023-07-11 NOTE — ED Provider Notes (Signed)
 [facility] AT [facility] Provider Note   CSN: [id number] Arrival date & time: 07/11/23  0153     History  Chief Complaint  Patient presents with   Headache    Bethany Craig is a 48 y.o. female.  48 yo female with history of prediabetes, migraines presents with complaint of chest tightness, headache. States onset of headache after getting into an argument with her sister at 3pm yesterday, took Ibuprofen  and the headache resolved. Patient woke up at 1am today with chest tightness and palpitations with return of her headache. Associated with light sensitivity, similar to prior headaches but not recently, not similar to her migraines which typically effect her right eye area whereas this is more global. No other complaints or concerns.        Home Medications Prior to Admission medications   Medication Sig Start Date End Date Taking? Authorizing Provider  ibuprofen  (ADVIL ) 600 MG tablet Take 1 tablet (600 mg total) by mouth every 6 (six) hours as needed. 11/14/21  Yes Spence Dux, PA-C  Ascorbic Acid (VITAMIN C DROPS MT) Use as directed 90 mg in the mouth or throat. Patient not taking: Reported on 07/11/2023    [provider]  Cholecalciferol 125 MCG (5000 UT) capsule Take by mouth. Patient not taking: Reported on 07/11/2023    [provider]  Cyanocobalamin (VITAMIN B12 PO) Take 5,000 mcg by mouth. Patient not taking: Reported on 07/11/2023    [provider]  Cyanocobalamin 5000 MCG SUBL Place 120 mg under the tongue. Patient not taking: Reported on 07/11/2023    [provider]  MAGNESIUM PO Take by mouth. Patient not taking: Reported on 07/11/2023    [provider]  metFORMIN  (GLUCOPHAGE ) 500 MG tablet 1 po with lunch daily Patient not taking: Reported on 07/11/2023 03/04/21   Marceil Sensor, DO  methocarbamol  (ROBAXIN ) 500 MG tablet Take 1 tablet (500 mg total) by mouth 2 (two) times  daily. Patient not taking: Reported on 07/11/2023 11/14/21   Spence Dux, PA-C  Misc Natural Products (JOINT SUPPORT PO) Take by mouth. Patient not taking: Reported on 07/11/2023    [provider]  Multiple Vitamins-Minerals (ZINC PO) Take by mouth. Patient not taking: Reported on 07/11/2023    [provider]  Nutritional Supplements (NUTRITIONAL SUPPLEMENT PO) Take by mouth. Collagen Peptides Patient not taking: Reported on 07/11/2023    [provider]  polyethylene glycol powder (GLYCOLAX /MIRALAX ) 17 GM/SCOOP powder May use once or twice daily until normal stools, then prn constipation Patient not taking: Reported on 07/11/2023 01/08/21   Marceil Sensor, DO  POTASSIUM PO Take 90 mg by mouth. Patient not taking: Reported on 07/11/2023    [provider]  Probiotic Product (PROBIOTIC PO) Take by mouth. Patient not taking: Reported on 07/11/2023    [provider]  TURMERIC CURCUMIN PO Take by mouth. Patient not taking: Reported on 07/11/2023    [provider]  vitamin k 100 MCG tablet Take 100 mcg by mouth daily. Patient not taking: Reported on 07/11/2023    [provider]      Allergies    Codeine    Review of Systems   Review of Systems Negative except as per HPI Physical Exam Updated Vital Signs BP 123/79   Pulse 74   Temp 98 F (36.7 C) (Oral)   Resp 13   LMP 09/28/2020   SpO2 95%  Physical Exam Vitals and nursing note reviewed.  Constitutional:  General: She is not in acute distress.    Appearance: She is well-developed. She is not diaphoretic.  HENT:     Head: Normocephalic and atraumatic.  Cardiovascular:     Rate and Rhythm: Normal rate and regular rhythm.     Pulses: Normal pulses.     Heart sounds: Normal heart sounds. No murmur heard. Pulmonary:     Effort: Pulmonary effort is normal.     Breath sounds: Normal breath sounds.  Skin:    General: Skin is warm and dry.     Findings: No erythema  or rash.  Neurological:     Mental Status: She is alert and oriented to person, place, and time.  Psychiatric:        Behavior: Behavior normal.     ED Results / Procedures / Treatments   Labs (all labs ordered are listed, but only abnormal results are displayed) Labs Reviewed  BASIC METABOLIC PANEL WITH GFR - Abnormal; Notable for the following components:      Result Value   Potassium 3.3 (*)    Glucose, Bld 122 (*)    Calcium 8.7 (*)    All other components within normal limits  CBC  TROPONIN I (HIGH SENSITIVITY)  TROPONIN I (HIGH SENSITIVITY)    EKG EKG Interpretation Date/Time:  Monday July 11 2023 02:04:09 EDT Ventricular Rate:  82 PR Interval:  164 QRS Duration:  96 QT Interval:  363 QTC Calculation: 424 R Axis:   63  Text Interpretation: Sinus rhythm No significant change since last tracing Confirmed by Eldon Greenland ([id number]) on 07/11/2023 2:17:03 AM  Radiology DG Chest Port 1 View Result Date: 07/11/2023 CLINICAL DATA:  Palpitations and chest tightness. EXAM: PORTABLE CHEST 1 VIEW COMPARISON:  November 14, 2021 FINDINGS: The heart size and mediastinal contours are within normal limits. Both lungs are clear. The visualized skeletal structures are unremarkable. IMPRESSION: No active disease. Electronically Signed   By: Virgle Grime M.D.   On: 07/11/2023 03:36    Procedures Procedures    Medications Ordered in ED Medications  aspirin  chewable tablet 324 mg (324 mg Oral Given 07/11/23 0244)  potassium chloride  SA (KLOR-CON  M) CR tablet 40 mEq (40 mEq Oral Given 07/11/23 0340)  sodium chloride  0.9 % bolus 1,000 mL (1,000 mLs Intravenous New Bag/Given 07/11/23 0512)  morphine  (PF) 4 MG/ML injection 4 mg (4 mg Intravenous Given 07/11/23 0513)  ondansetron  (ZOFRAN ) injection 4 mg (4 mg Intravenous Given 07/11/23 1027)    ED Course/ Medical Decision Making/ A&P                                 Medical Decision Making Amount and/or Complexity of Data  Reviewed Labs: ordered. Radiology: ordered.  Risk OTC drugs. Prescription drug management.   This patient presents to the ED for concern of chest tightness/palpitations, headache, this involves an extensive number of treatment options, and is a complaint that carries with it a high risk of complications and morbidity.  The differential diagnosis includes but not limited to ACS, HTN emergency, migraine, SAH, stress, electrolyte/metabolic derangement    Co morbidities that complicate the patient evaluation  Migraine, prediabetes    Additional history obtained:  External records from outside source obtained and reviewed including prior labs on file for comparison   Lab Tests:  I Ordered, and personally interpreted labs.  The pertinent results include: CBC within normals.  BMP with mild hypokalemia potassium 3.3.  Troponins are unchanged at 3 and 2   Imaging Studies ordered:  I ordered imaging studies including CXR  I independently visualized and interpreted imaging which showed no acute process I agree with the radiologist interpretation   Cardiac Monitoring: / EKG:  The patient was maintained on a cardiac monitor.  I personally viewed and interpreted the cardiac monitored which showed an underlying rhythm of: sinus rhythm, rate 82   Problem List / ED Course / Critical interventions / Medication management  48 year old female with complaint of headache, chest tightness and palpitations as above.  On exam, nontoxic and in no distress.  Moves all extremities equally.  Heart and lungs clear.  Lab work reassuring.  Chest x-ray normal, EKG without ischemic changes.  Patient is provided with aspirin , Zofran , morphine , IV fluids with improvement in symptoms.  Recommend follow-up with primary care provider, provided with referral to cardiology. I ordered medication including aspirin  for headache and chest discomfort, potassium for mild hypokalemia.  Some improvement in pain after the  aspirin .  Added Zofran , morphine  and fluids for ongoing headache. Reevaluation of the patient after these medicines showed that the patient improved I have reviewed the patients home medicines and have made adjustments as needed   Consultations Obtained:  I requested consultation with the ER attending, Dr. Wallis Gun,  and discussed lab and imaging findings as well as pertinent plan - they recommend: agrees with plan of care. Discussed patient's presentation, non toxic, normal gait, symptoms improved.    Social Determinants of Health:  Lives at home with family   Test / Admission - Considered:  Stable for dc         Final Clinical Impression(s) / ED Diagnoses Final diagnoses:  Chest pain, unspecified type  Acute nonintractable headache, unspecified headache type  Hypokalemia    Rx / DC Orders ED Discharge Orders          Ordered    Ambulatory referral to Cardiology        07/11/23 0507              Darlis Eisenmenger, PA-C 07/11/23 1610    Eldon Greenland, MD 07/11/23 [phone]

## 2023-07-11 NOTE — Discharge Instructions (Addendum)
 Return to the ER for worsening or concerning symptoms. Follow up with your primary care provider for recheck. Follow up with cardiology for your chest discomfort.

## 2023-07-11 NOTE — ED Triage Notes (Signed)
 Pt. Arrives c/o a splitting headache. States that it started earlier before going to sleep she took ibuprofen  and the pain went away. When she woke up the pain was worse. She started experiencing heart palpitations and chest tightness. Endorses nausea denies vomiting.

## 2023-11-21 ENCOUNTER — Other Ambulatory Visit: Payer: Self-pay

## 2023-11-21 ENCOUNTER — Encounter: Payer: Self-pay | Admitting: Cardiology

## 2023-11-21 ENCOUNTER — Ambulatory Visit
Admission: EM | Admit: 2023-11-21 | Discharge: 2023-11-21 | Disposition: A | Discharge status: Home / Self Care | Attending: Physician Assistant | Admitting: Physician Assistant

## 2023-11-21 DIAGNOSIS — J069 Acute upper respiratory infection, unspecified: Secondary | ICD-10-CM | POA: Diagnosis not present

## 2023-11-21 DIAGNOSIS — Z20822 Contact with and (suspected) exposure to covid-19: Secondary | ICD-10-CM | POA: Diagnosis not present

## 2023-11-21 DIAGNOSIS — R072 Precordial pain: Secondary | ICD-10-CM | POA: Insufficient documentation

## 2023-11-21 DIAGNOSIS — J029 Acute pharyngitis, unspecified: Secondary | ICD-10-CM | POA: Insufficient documentation

## 2023-11-21 LAB — POC COVID19/FLU A&B COMBO
Covid Antigen, POC: NEGATIVE
Influenza A Antigen, POC: NEGATIVE
Influenza B Antigen, POC: NEGATIVE

## 2023-11-21 LAB — POCT RAPID STREP A (OFFICE): Rapid Strep A Screen: NEGATIVE

## 2023-11-21 MED ORDER — BENZONATATE 100 MG PO CAPS
100.0000 mg | ORAL_CAPSULE | Freq: Three times a day (TID) | ORAL | 0 refills | Status: AC
Start: 2023-11-21 — End: ?

## 2023-11-21 MED ORDER — PREDNISONE 20 MG PO TABS: 40.0000 mg | ORAL_TABLET | Freq: Every day | ORAL | 0 refills | Status: AC

## 2023-11-21 NOTE — Discharge Instructions (Addendum)
 Your testing was negative for COVID, flu and strep.  We have sent a sample of your strep test off for a culture to definitively rule this out.  If it is positive you will be contacted by the result nurse with next steps.  Based on your described symptoms and the duration of symptoms it is likely that you have a viral upper respiratory infection (often called a cold)  Symptoms can last for 3-10 days with lingering cough and intermittent symptoms potentially  lasting several  weeks after that.  The goal of treatment at this time is to reduce your symptoms and discomfort   You can use the following medications and measures to help yourself feel better until your body fights this off: DayQuil/NyQuil, TheraFlu, Alka-Seltzer  (these medications typically have the same active ingredients in them so you can choose whichever one you prefer and take consistently during the day and night according to the manufactures instructions.) Flonase A daily antihistamine such as Zyrtec, Claritin, Allegra per your preference.  Please choose 1 and take consistently. Increased fluids.  It is recommended that you take in at least 64 ounces of water per day when you are not sick so it is important to increase this when you are sick and your body may be running fever. Rest Cough drops Chloraseptic throat spray to help with sore throat Nasal saline spray or nasal flushes to help with congestion and runny nose  I have sent in a script for Prednisone  burst to be taken in the morning with breakfast per the instructions on the container Remember that steroids can cause sleeplessness, irritability, increased hunger and elevated glucose levels so be mindful of these side effects.   If your symptoms seem like they are getting worse over the next 5 to 7 days or not improving you can always follow-up here in urgent care or go to your primary care provider for further management. Go to the ER if you begin to have more serious  symptoms such as shortness of breath, trouble breathing, loss of consciousness, swelling around the eyes, high fever, severe lasting headaches, vision changes or neck pain/stiffness.

## 2023-11-21 NOTE — ED Provider Notes (Signed)
 [facility]    CSN: [id number] Arrival date & time: 11/21/23  1229      History   Chief Complaint Chief Complaint  Patient presents with   Sore Throat   Generalized Body Aches   Mold Exposure     HPI Bethany Craig is a 48 y.o. female.   HPI  Pt is here for concerns of body aches, fatigue, headaches, sore throat. She states her daughter tested positive for COVID last week and most of the household started to develop symptoms. She states she feels like her sense of taste is altered. She completed 3 home COVID tests at home which were all negative. She also reports exposure to mold and is not sure if this has anything to do with her symptoms. She states she has some phlegm in the back of the throat but denies productive coughing. She admits to some SOB and sore throat.  Interventions:she has been taking Dayquil and nyquil for the past 3 days. She states these are not providing much relief for her symptoms other than mild reduction of headache.   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 Authorizing Provider  benzonatate  (TESSALON ) 100 MG capsule Take 1 capsule (100 mg total) by mouth every 8 (eight) hours. 11/21/23  Yes Yazmine Sorey E, PA-C  predniSONE  (DELTASONE ) 20 MG tablet Take 2 tablets (40 mg total) by mouth daily for 5 days. 11/21/23 11/26/23 Yes Raeven Pint E, PA-C  Ascorbic Acid (VITAMIN C DROPS MT) Use as directed 90 mg in the mouth or throat. Patient not taking: Reported on 07/11/2023    [provider]  Cholecalciferol 125 MCG (5000 UT) capsule Take by mouth. Patient not taking: Reported on 07/11/2023    [provider]  Cyanocobalamin (VITAMIN B12 PO) Take 5,000 mcg by mouth. Patient not taking: Reported on 07/11/2023    [provider]  Cyanocobalamin 5000 MCG SUBL Place 120 mg under the tongue. Patient not taking: Reported on 07/11/2023    [provider]  ibuprofen  (ADVIL ) 600 MG tablet Take 1 tablet (600 mg total) by mouth  every 6 (six) hours as needed. 11/14/21   Bernis Ernst, PA-C  MAGNESIUM PO Take by mouth. Patient not taking: Reported on 07/11/2023    [provider]  metFORMIN  (GLUCOPHAGE ) 500 MG tablet 1 po with lunch daily Patient not taking: Reported on 07/11/2023 03/04/21   Midge Sober, DO  methocarbamol  (ROBAXIN ) 500 MG tablet Take 1 tablet (500 mg total) by mouth 2 (two) times daily. Patient not taking: Reported on 07/11/2023 11/14/21   Bernis Ernst, PA-C  Misc Natural Products (JOINT SUPPORT PO) Take by mouth. Patient not taking: Reported on 07/11/2023    [provider]  Multiple Vitamins-Minerals (ZINC PO) Take by mouth. Patient not taking: Reported on 07/11/2023    [provider]  Nutritional Supplements (NUTRITIONAL SUPPLEMENT PO) Take by mouth. Collagen Peptides Patient not taking: Reported on 07/11/2023    [provider]  polyethylene glycol powder (GLYCOLAX /MIRALAX ) 17 GM/SCOOP powder May use once or twice daily until normal stools, then prn  constipation Patient not taking: Reported on 07/11/2023 01/08/21   Midge Sober, DO  POTASSIUM PO Take 90 mg by mouth. Patient not taking: Reported on 07/11/2023    [provider]  Probiotic Product (PROBIOTIC PO) Take by mouth. Patient not taking: Reported on 07/11/2023    [provider]  TURMERIC CURCUMIN PO Take by mouth. Patient not taking: Reported on 07/11/2023    [provider]  vitamin k 100 MCG tablet Take 100 mcg by mouth daily. Patient not taking: Reported on 07/11/2023    [provider]    Family History Family History  Problem Relation Age of Onset   Eating disorder Mother    Drug abuse Mother    Schizophrenia Mother    Bipolar disorder Mother    Anxiety disorder Mother    Heart disease Mother    Diabetes Mother    Hyperlipidemia Mother    Stroke Mother     Social History Social History   Tobacco Use   Smoking status: Never   Smokeless tobacco: Former  Building services engineer status: Never Used  Substance Use Topics   Alcohol use: No   Drug use: No     Allergies   Codeine   Review of Systems Review of Systems  Constitutional:  Positive for chills and fatigue. Negative for fever.  HENT:  Positive for congestion, postnasal drip, rhinorrhea and sore throat (reports her throat feels dry).   Respiratory:  Positive for cough and shortness of breath. Negative for wheezing.   Gastrointestinal:  Positive for diarrhea, nausea and vomiting.  Musculoskeletal:  Positive for myalgias.  Skin:  Negative for rash.  Neurological:  Positive for headaches.     Physical Exam Triage Vital Signs ED Triage Vitals  Encounter Vitals Group     BP 11/21/23 1351 124/83     Girls Systolic BP Percentile --      Girls Diastolic BP Percentile --      Boys Systolic BP Percentile --      Boys Diastolic BP Percentile --      Pulse Rate 11/21/23 1351 76     Resp 11/21/23 1351 18     Temp 11/21/23 1351 98 F (36.7 C)     Temp Source  11/21/23 1351 Oral     SpO2 11/21/23 1351 97 %     Weight 11/21/23 1351 250 lb (113.4 kg)     Height 11/21/23 1351 5' 3 (1.6 m)     Head Circumference --  Peak Flow --      Pain Score 11/21/23 1423 6     Pain Loc --      Pain Education --      Exclude from Growth Chart --    No data found.  Updated Vital Signs BP 124/83 (BP Location: Right Arm)   Pulse 76   Temp 98 F (36.7 C) (Oral)   Resp 18   Ht 5' 3 (1.6 m)   Wt 250 lb (113.4 kg)   LMP 09/28/2020   SpO2 97%   BMI 44.29 kg/m   Visual Acuity Right Eye Distance:   Left Eye Distance:   Bilateral Distance:    Right Eye Near:   Left Eye Near:    Bilateral Near:     Physical Exam Vitals reviewed.  Constitutional:      General: She is awake.     Appearance: Normal appearance. She is well-developed and well-groomed.  HENT:     Head: Normocephalic and atraumatic.     Right Ear: Hearing, tympanic membrane and ear canal normal.     Left Ear: Hearing, tympanic membrane and ear canal normal.     Mouth/Throat:     Lips: Pink.     Mouth: Mucous membranes are moist.     Pharynx: Oropharynx is clear. Uvula midline. No pharyngeal swelling, oropharyngeal exudate, posterior oropharyngeal erythema, uvula swelling or postnasal drip.  Cardiovascular:     Rate and Rhythm: Normal rate and regular rhythm.     Pulses: Normal pulses.          Radial pulses are 2+ on the right side and 2+ on the left side.     Heart sounds: Normal heart sounds. No murmur heard.    No friction rub. No gallop.  Pulmonary:     Effort: Pulmonary effort is normal.     Breath sounds: Normal breath sounds. No decreased air movement. No decreased breath sounds, wheezing, rhonchi or rales.  Musculoskeletal:     Cervical back: Normal range of motion and neck supple.  Lymphadenopathy:     Head:     Right side of head: No submental, submandibular or preauricular adenopathy.     Left side of head: No submental, submandibular or preauricular adenopathy.      Cervical:     Right cervical: No superficial cervical adenopathy.    Left cervical: No superficial cervical adenopathy.     Upper Body:     Right upper body: No supraclavicular adenopathy.     Left upper body: No supraclavicular adenopathy.  Neurological:     Mental Status: She is alert.  Psychiatric:        Mood and Affect: Mood normal.        Behavior: Behavior normal. Behavior is cooperative.      UC Treatments / Results  Labs (all labs ordered are listed, but only abnormal results are displayed) Labs Reviewed  POCT RAPID STREP A (OFFICE) - Normal  CULTURE, GROUP A STREP ([facility])  POC COVID19/FLU A&B COMBO    EKG   Radiology No results found.  Procedures Procedures (including critical care time)  Medications Ordered in UC Medications - No data to display  Initial Impression / Assessment and Plan / UC Course  I have reviewed the triage vital signs and the nursing notes.  Pertinent labs & imaging results that were available during my care of the patient were reviewed by me and considered in my medical decision making (see chart for details).  Final Clinical Impressions(s) / UC Diagnoses   Final diagnoses:  Sore throat  Upper respiratory tract infection, unspecified type   Patient presents today with concerns for sore throat, nasal congestion, shortness of breath, coughing, rhinorrhea, body aches that has been going on for about 6 days.  She reports multiple people in her household tested positive for COVID last week but her testing has remained negative.  Rapid COVID flu, strep testing were negative.  Will get strep culture for definitive rule out given sore throat symptoms.  Physical exam is largely reassuring as are vitals.  To assist with symptoms we will start patient on prednisone  40 mg p.o. daily x 5-day burst along with Tessalon  Perles.  Recommend continued use of OTC medications as needed for symptomatic relief.  ED and return precautions reviewed and  provided in AVS.  Follow-up as needed    Discharge Instructions      Your testing was negative for COVID, flu and strep.  We have sent a sample of your strep test off for a culture to definitively rule this out.  If it is positive you will be contacted by the result nurse with next steps.  Based on your described symptoms and the duration of symptoms it is likely that you have a viral upper respiratory infection (often called a cold)  Symptoms can last for 3-10 days with lingering cough and intermittent symptoms potentially  lasting several  weeks after that.  The goal of treatment at this time is to reduce your symptoms and discomfort   You can use the following medications and measures to help yourself feel better until your body fights this off: DayQuil/NyQuil, TheraFlu, Alka-Seltzer  (these medications typically have the same active ingredients in them so you can choose whichever one you prefer and take consistently during the day and night according to the manufactures instructions.) Flonase A daily antihistamine such as Zyrtec, Claritin, Allegra per your preference.  Please choose 1 and take consistently. Increased fluids.  It is recommended that you take in at least 64 ounces of water per day when you are not sick so it is important to increase this when you are sick and your body may be running fever. Rest Cough drops Chloraseptic throat spray to help with sore throat Nasal saline spray or nasal flushes to help with congestion and runny nose  I have sent in a script for Prednisone  burst to be taken in the morning with breakfast per the instructions on the container Remember that steroids can cause sleeplessness, irritability, increased hunger and elevated glucose levels so be mindful of these side effects.   If your symptoms seem like they are getting worse over the next 5 to 7 days or not improving you can always follow-up here in urgent care or go to your primary care provider  for further management. Go to the ER if you begin to have more serious symptoms such as shortness of breath, trouble breathing, loss of consciousness, swelling around the eyes, high fever, severe lasting headaches, vision changes or neck pain/stiffness.       ED Prescriptions     Medication Sig Dispense Auth. Provider   predniSONE  (DELTASONE ) 20 MG tablet Take 2 tablets (40 mg total) by mouth daily for 5 days. 10 tablet Damier Disano E, PA-C   benzonatate  (TESSALON ) 100 MG capsule Take 1 capsule (100 mg total) by mouth every 8 (eight) hours. 21 capsule Balbina Depace E, PA-C      PDMP not reviewed this encounter.  Marylene Rocky BRAVO, PA-C 11/21/23 1546

## 2023-11-21 NOTE — Progress Notes (Deleted)
 Cardiology Office Note   Date:  11/21/2023   ID:  TOINETTE LACKIE, DOB 1975/06/03, MRN [id number]  PCP:  Valma Lannie DELENA, PA-C  Cardiologist:   None Referring:  ***  No chief complaint on file.     History of Present Illness: Bethany Craig is a 48 y.o. female who was referred by Beverley Leita DELENA, PA-C for evaluation of chest pain.  ***   Past Medical History:  Diagnosis Date   Anemia    Ankle pain    Anxiety    B12 deficiency    Constipation    Fatigue    High cholesterol    Hip pain    Hypothyroidism    Knee pain    Prediabetes    Swelling of ankle    Vitamin D  deficiency     Past Surgical History:  Procedure Laterality Date   CESAREAN SECTION       Current Outpatient Medications  Medication Sig Dispense Refill   Ascorbic Acid (VITAMIN C DROPS MT) Use as directed 90 mg in the mouth or throat. (Patient not taking: Reported on 07/11/2023)     benzonatate  (TESSALON ) 100 MG capsule Take 1 capsule (100 mg total) by mouth every 8 (eight) hours. 21 capsule 0   Cholecalciferol 125 MCG (5000 UT) capsule Take by mouth. (Patient not taking: Reported on 07/11/2023)     Cyanocobalamin (VITAMIN B12 PO) Take 5,000 mcg by mouth. (Patient not taking: Reported on 07/11/2023)     Cyanocobalamin 5000 MCG SUBL Place 120 mg under the tongue. (Patient not taking: Reported on 07/11/2023)     ibuprofen  (ADVIL ) 600 MG tablet Take 1 tablet (600 mg total) by mouth every 6 (six) hours as needed. 30 tablet 0   MAGNESIUM PO Take by mouth. (Patient not taking: Reported on 07/11/2023)     metFORMIN  (GLUCOPHAGE ) 500 MG tablet 1 po with lunch daily (Patient not taking: Reported on 07/11/2023) 30 tablet 0   methocarbamol  (ROBAXIN ) 500 MG tablet Take 1 tablet (500 mg total) by mouth 2 (two) times daily. (Patient not taking: Reported on 07/11/2023) 20 tablet 0   Misc Natural Products (JOINT SUPPORT PO) Take by mouth. (Patient not taking: Reported on 07/11/2023)     Multiple Vitamins-Minerals (ZINC  PO) Take by mouth. (Patient not taking: Reported on 07/11/2023)     Nutritional Supplements (NUTRITIONAL SUPPLEMENT PO) Take by mouth. Collagen Peptides (Patient not taking: Reported on 07/11/2023)     polyethylene glycol powder (GLYCOLAX /MIRALAX ) 17 GM/SCOOP powder May use once or twice daily until normal stools, then prn constipation (Patient not taking: Reported on 07/11/2023) 116 g 0   POTASSIUM PO Take 90 mg by mouth. (Patient not taking: Reported on 07/11/2023)     predniSONE  (DELTASONE ) 20 MG tablet Take 2 tablets (40 mg total) by mouth daily for 5 days. 10 tablet 0   Probiotic Product (PROBIOTIC PO) Take by mouth. (Patient not taking: Reported on 07/11/2023)     TURMERIC CURCUMIN PO Take by mouth. (Patient not taking: Reported on 07/11/2023)     vitamin k 100 MCG tablet Take 100 mcg by mouth daily. (Patient not taking: Reported on 07/11/2023)     No current facility-administered medications for this visit.    Allergies:   Codeine    Social History:  The patient  reports that she has never smoked. She has quit using smokeless tobacco. She reports that she does not drink alcohol and does not use drugs.   Family History:  The  patient's ***family history includes Anxiety disorder in her mother; Bipolar disorder in her mother; Diabetes in her mother; Drug abuse in her mother; Eating disorder in her mother; Heart disease in her mother; Hyperlipidemia in her mother; Schizophrenia in her mother; Stroke in her mother.    ROS:  Please see the history of present illness.   Otherwise, review of systems are positive for {NONE DEFAULTED:18576}.   All other systems are reviewed and negative.    PHYSICAL EXAM: VS:  LMP 09/28/2020  , BMI There is no height or weight on file to calculate BMI. GENERAL:  Well appearing HEENT:  Pupils equal round and reactive, fundi not visualized, oral mucosa unremarkable NECK:  No jugular venous distention, waveform within normal limits, carotid upstroke brisk and  symmetric, no bruits, no thyromegaly LYMPHATICS:  No cervical, inguinal adenopathy LUNGS:  Clear to auscultation bilaterally BACK:  No CVA tenderness CHEST:  Unremarkable HEART:  PMI not displaced or sustained,S1 and S2 within normal limits, no S3, no S4, no clicks, no rubs, *** murmurs ABD:  Flat, positive bowel sounds normal in frequency in pitch, no bruits, no rebound, no guarding, no midline pulsatile mass, no hepatomegaly, no splenomegaly EXT:  2 plus pulses throughout, no edema, no cyanosis no clubbing SKIN:  No rashes no nodules NEURO:  Cranial nerves II through XII grossly intact, motor grossly intact throughout PSYCH:  Cognitively intact, oriented to person place and time    EKG:        Recent Labs: 07/11/2023: BUN 13; Creatinine, Ser 0.44; Hemoglobin 12.5; Platelets 348; Potassium 3.3; Sodium 138    Lipid Panel No results found for: CHOL, TRIG, HDL, CHOLHDL, VLDL, LDLCALC, LDLDIRECT    Wt Readings from Last 3 Encounters:  11/21/23 250 lb (113.4 kg)  11/14/21 224 lb 13.9 oz (102 kg)  01/28/21 225 lb (102.1 kg)      Other studies Reviewed: Additional studies/ records that were reviewed today include: ***. Review of the above records demonstrates:  Please see elsewhere in the note.  ***   ASSESSMENT AND PLAN:  ***   Current medicines are reviewed at length with the patient today.  The patient {ACTIONS; HAS/DOES NOT HAVE:19233} concerns regarding medicines.  The following changes have been made:  {PLAN; NO CHANGE:13088:s}  Labs/ tests ordered today include: *** No orders of the defined types were placed in this encounter.    Disposition:   FU with ***    Signed, Lynwood Schilling, MD  11/21/2023 9:34 PM    [facility]

## 2023-11-21 NOTE — ED Triage Notes (Signed)
 Pt presents with complaints of headaches, generalized body aches, and sore throat x 6 days. Family member tested COVID+ in the past week. Pt states she took three at-home COVID tests and all were negative. One was a combo test with Flu A/B, this was also negative. Currently rates overall pain a 6/10. Denies fevers at home. OTC Cold & Flu taken with no improvement.  Mold exposure in home x 1 month. Removal in progress.

## 2023-11-24 ENCOUNTER — Ambulatory Visit: Attending: Cardiology | Admitting: Cardiology

## 2023-11-24 DIAGNOSIS — R072 Precordial pain: Secondary | ICD-10-CM

## 2023-11-25 ENCOUNTER — Ambulatory Visit (HOSPITAL_COMMUNITY): Payer: Self-pay

## 2023-11-25 LAB — CULTURE, GROUP A STREP (THRC)
